# Patient Record
Sex: Female | Born: 1995 | Race: White | Hispanic: No | Marital: Single | Smoking: Current every day smoker
Health system: Southern US, Community
[De-identification: ages and names within clinical notes are randomized; demographics above are authoritative.]

## PROBLEM LIST (undated history)

## (undated) DIAGNOSIS — N926 Irregular menstruation, unspecified: Secondary | ICD-10-CM

## (undated) DIAGNOSIS — F329 Major depressive disorder, single episode, unspecified: Secondary | ICD-10-CM

## (undated) DIAGNOSIS — Z3046 Encounter for surveillance of implantable subdermal contraceptive: Principal | ICD-10-CM

## (undated) DIAGNOSIS — F419 Anxiety disorder, unspecified: Secondary | ICD-10-CM

## (undated) DIAGNOSIS — Z309 Encounter for contraceptive management, unspecified: Secondary | ICD-10-CM

## (undated) DIAGNOSIS — H9190 Unspecified hearing loss, unspecified ear: Secondary | ICD-10-CM

## (undated) DIAGNOSIS — F32A Depression, unspecified: Secondary | ICD-10-CM

## (undated) DIAGNOSIS — F988 Other specified behavioral and emotional disorders with onset usually occurring in childhood and adolescence: Secondary | ICD-10-CM

## (undated) HISTORY — DX: Other specified behavioral and emotional disorders with onset usually occurring in childhood and adolescence: F98.8

## (undated) HISTORY — DX: Irregular menstruation, unspecified: N92.6

## (undated) HISTORY — DX: Anxiety disorder, unspecified: F41.9

## (undated) HISTORY — DX: Unspecified hearing loss, unspecified ear: H91.90

## (undated) HISTORY — PX: TONSILLECTOMY AND ADENOIDECTOMY: SHX28

## (undated) HISTORY — DX: Encounter for contraceptive management, unspecified: Z30.9

## (undated) HISTORY — PX: OTHER SURGICAL HISTORY: SHX169

## (undated) HISTORY — DX: Major depressive disorder, single episode, unspecified: F32.9

## (undated) HISTORY — DX: Depression, unspecified: F32.A

## (undated) HISTORY — DX: Encounter for surveillance of implantable subdermal contraceptive: Z30.46

---
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
## 2015-06-13 ENCOUNTER — Encounter: Payer: Medicaid Other | Admitting: Women's Health

## 2015-06-13 ENCOUNTER — Encounter: Payer: Self-pay | Admitting: Women's Health

## 2015-06-27 ENCOUNTER — Encounter: Payer: Self-pay | Admitting: Women's Health

## 2015-06-27 ENCOUNTER — Encounter: Payer: Medicaid Other | Admitting: Women's Health

## 2015-07-10 ENCOUNTER — Encounter: Payer: Medicaid Other | Admitting: Women's Health

## 2015-07-10 ENCOUNTER — Encounter: Payer: Self-pay | Admitting: *Deleted

## 2015-09-20 ENCOUNTER — Ambulatory Visit (INDEPENDENT_AMBULATORY_CARE_PROVIDER_SITE_OTHER): Payer: Medicaid Other | Admitting: Adult Health

## 2015-09-20 ENCOUNTER — Encounter: Payer: Self-pay | Admitting: Adult Health

## 2015-09-20 VITALS — BP 120/80 | HR 90 | Ht 62.5 in | Wt 144.0 lb

## 2015-09-20 DIAGNOSIS — Z3202 Encounter for pregnancy test, result negative: Secondary | ICD-10-CM | POA: Diagnosis not present

## 2015-09-20 DIAGNOSIS — N938 Other specified abnormal uterine and vaginal bleeding: Secondary | ICD-10-CM

## 2015-09-20 DIAGNOSIS — Z30011 Encounter for initial prescription of contraceptive pills: Secondary | ICD-10-CM | POA: Diagnosis not present

## 2015-09-20 DIAGNOSIS — Z309 Encounter for contraceptive management, unspecified: Secondary | ICD-10-CM | POA: Insufficient documentation

## 2015-09-20 DIAGNOSIS — N926 Irregular menstruation, unspecified: Secondary | ICD-10-CM

## 2015-09-20 DIAGNOSIS — Z3046 Encounter for surveillance of implantable subdermal contraceptive: Secondary | ICD-10-CM | POA: Diagnosis not present

## 2015-09-20 HISTORY — DX: Encounter for surveillance of implantable subdermal contraceptive: Z30.46

## 2015-09-20 HISTORY — DX: Encounter for contraceptive management, unspecified: Z30.9

## 2015-09-20 HISTORY — DX: Irregular menstruation, unspecified: N92.6

## 2015-09-20 LAB — POCT URINE PREGNANCY: Preg Test, Ur: NEGATIVE

## 2015-09-20 MED ORDER — NORETHIN-ETH ESTRAD-FE BIPHAS 1 MG-10 MCG / 10 MCG PO TABS: 1.0000 | ORAL_TABLET | Freq: Every day | ORAL | Status: DC

## 2015-09-20 NOTE — Progress Notes (Signed)
Subjective:     Patient ID: Adrienne Parker, female   DOB: 24-Oct-1995, 20 y.o.   MRN: [id number]  HPI Adrienne Parker is a 20 year old white female in for nexplanon removal has had irregular bleeding with this nexplanon and wants it out and to try OCs.  Review of Systems Patient denies any headaches, hearing loss, fatigue, blurred vision, shortness of breath, chest pain, abdominal pain, problems with bowel movements, urination, or intercourse. No joint pain or mood swings.+ irregular bleeding, for nexplanon removal Reviewed past medical,surgical, social and family history. Reviewed medications and allergies.     Objective:   Physical Exam BP 120/80 mmHg  Pulse 90  Ht 5' 2.5" (1.588 m)  Wt 144 lb (65.318 kg)  BMI 25.90 kg/m2UPT negative,consent signed, time out called, Left arm cleansed with betadine, and injected with 1.5 cc 2% lidocaine and waited til numb.Under sterile technique a #11 blade was used to make small vertical incision, and a curved forceps was used to easily remove rod. Steri strips applied. Pressure dressing applied.She wants to start OCs.    Assessment:    Nexplanon removal Contraceptive management   Irregular bleeding with nexplanon   Plan:    GC/CHL sent on urine Use condoms x 4 weeks, keep clean and dry x 24 hours, no heavy lifting, keep steri strips on x 72 hours, Keep pressure dressing on x 24 hours. Follow up prn problems.   Rx  Lo loestrin take 1 daily disp 1 pack with 11 refills, given 1 pack to start today Pap and physical in 1 year

## 2015-09-20 NOTE — Patient Instructions (Signed)
Use condoms x 4 weeks, keep clean and dry x 24 hours, no heavy lifting, keep steri strips on x 72 hours, Keep pressure dressing on x 24 hours. Follow up prn problems. Start lo loestrin today Pap and physical in 1 year

## 2015-09-22 LAB — GC/CHLAMYDIA PROBE AMP
CHLAMYDIA, DNA PROBE: NEGATIVE
Neisseria gonorrhoeae by PCR: NEGATIVE

## 2015-11-16 ENCOUNTER — Telehealth (HOSPITAL_COMMUNITY): Payer: Self-pay | Admitting: *Deleted

## 2015-11-16 NOTE — Telephone Encounter (Signed)
Called pt to resch appt for April 19th but there's no voicemail.

## 2015-11-21 ENCOUNTER — Telehealth (HOSPITAL_COMMUNITY): Payer: Self-pay | Admitting: *Deleted

## 2015-11-21 ENCOUNTER — Encounter (HOSPITAL_COMMUNITY): Payer: Self-pay | Admitting: Psychiatry

## 2015-11-21 NOTE — Telephone Encounter (Signed)
Called number on file and phone does not have vm. Will try to call back at another time. Need to resch appt for December 06, 2015 due to provider being out of office.

## 2015-12-06 ENCOUNTER — Ambulatory Visit (HOSPITAL_COMMUNITY): Payer: Self-pay | Admitting: Psychiatry

## 2016-01-11 ENCOUNTER — Ambulatory Visit (INDEPENDENT_AMBULATORY_CARE_PROVIDER_SITE_OTHER): Payer: Medicaid Other | Admitting: Psychiatry

## 2016-01-11 ENCOUNTER — Other Ambulatory Visit (HOSPITAL_COMMUNITY): Payer: Self-pay | Admitting: Psychiatry

## 2016-01-11 ENCOUNTER — Encounter (HOSPITAL_COMMUNITY): Payer: Self-pay | Admitting: Psychiatry

## 2016-01-11 VITALS — BP 110/80 | Ht 62.0 in | Wt 148.0 lb

## 2016-01-11 DIAGNOSIS — F902 Attention-deficit hyperactivity disorder, combined type: Secondary | ICD-10-CM

## 2016-01-11 DIAGNOSIS — F909 Attention-deficit hyperactivity disorder, unspecified type: Secondary | ICD-10-CM | POA: Insufficient documentation

## 2016-01-11 DIAGNOSIS — F329 Major depressive disorder, single episode, unspecified: Secondary | ICD-10-CM | POA: Diagnosis not present

## 2016-01-11 DIAGNOSIS — F32A Depression, unspecified: Secondary | ICD-10-CM

## 2016-01-11 MED ORDER — VYVANSE 50 MG PO CAPS: 50.0000 mg | ORAL_CAPSULE | Freq: Every day | ORAL | Status: DC

## 2016-01-11 MED ORDER — PAROXETINE HCL 20 MG PO TABS: 20.0000 mg | ORAL_TABLET | Freq: Every day | ORAL | Status: DC

## 2016-01-11 MED ORDER — BUSPIRONE HCL 5 MG PO TABS: 5.0000 mg | ORAL_TABLET | Freq: Three times a day (TID) | ORAL | Status: DC
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
## 2016-02-07 ENCOUNTER — Ambulatory Visit (HOSPITAL_COMMUNITY): Payer: Medicaid Other | Admitting: Psychiatry

## 2016-02-09 ENCOUNTER — Ambulatory Visit (HOSPITAL_COMMUNITY): Payer: Medicaid Other | Admitting: Psychology

## 2016-03-06 ENCOUNTER — Telehealth (HOSPITAL_COMMUNITY): Payer: Self-pay | Admitting: *Deleted

## 2016-03-06 NOTE — Telephone Encounter (Signed)
Pt called stating she would like to resch appt for missed appt last week. Office informed pt they will call her back to resch. Called pt back at 2:24pm and lmtcb

## 2016-03-29 ENCOUNTER — Ambulatory Visit (HOSPITAL_COMMUNITY): Payer: Self-pay | Admitting: Psychiatry

## 2016-03-29 ENCOUNTER — Encounter (HOSPITAL_COMMUNITY): Payer: Self-pay

## 2016-04-02 ENCOUNTER — Encounter (HOSPITAL_COMMUNITY): Payer: Self-pay | Admitting: Psychiatry

## 2016-10-14 ENCOUNTER — Ambulatory Visit: Payer: Medicaid Other | Admitting: Adult Health

## 2019-08-20 NOTE — L&D Delivery Note (Signed)
OB/GYN Faculty Practice Delivery Note  Adrienne Parker is a 24 y.o. G1P0000 s/p NSVD at [redacted]w[redacted]d. She was admitted for IOL for gHTN.   ROM: 5h 78m with clear fluid GBS Status:  --[name] (02/24 1500) Maximum Maternal Temperature: 98.2 F  Labor Progress: . Patient presented to L&D for IOL for gHTN. Initial SVE: 0.5cm. Labor course was uncomplicated. Her blood pressures were labile but mostly in the normal range until about an hour prior to delivery. She mentioned after delivery that she had been pushing for about an hour but did not tell anyone. She then progressed to complete.   Delivery Date/Time: 10/22/2019 @ 1508 Delivery: Called to room and patient was complete and pushing. Baby delivered spontaneously by the nurse. No nuchal cord present. Shoulder and body delivered in usual fashion. Infant with spontaneous cry, placed on mother's abdomen, dried and stimulated. Cord clamped x 2 after 1-minute delay, and cut by FoB. Cord blood drawn. Placenta delivered spontaneously with gentle cord traction. Fundus firm with massage and pitocin started. Labia, perineum, vagina, and cervix inspected and significant for no lacerations.  Baby Weight: 2520g  Cord: central insertion, 3 vessel Placenta: Sent to L&D Complications: None Lacerations: None EBL: 75cc Analgesia: Epidural   Infant: APGAR (1 MIN): 9   APGAR (5 MINS): 9    Aldean Jewett MD, PGY-1 OBGYN Faculty Teaching Service  10/22/2019, 3:41 PM

## 2019-08-24 ENCOUNTER — Encounter: Payer: Self-pay | Admitting: Advanced Practice Midwife

## 2019-08-24 DIAGNOSIS — Z3400 Encounter for supervision of normal first pregnancy, unspecified trimester: Secondary | ICD-10-CM | POA: Insufficient documentation

## 2019-08-25 ENCOUNTER — Other Ambulatory Visit (HOSPITAL_COMMUNITY)
Admission: RE | Admit: 2019-08-25 | Discharge: 2019-08-25 | Disposition: A | Discharge status: Home / Self Care | Payer: Medicaid Other | Source: Ambulatory Visit | Attending: Adult Health | Admitting: Adult Health

## 2019-08-25 ENCOUNTER — Ambulatory Visit (INDEPENDENT_AMBULATORY_CARE_PROVIDER_SITE_OTHER): Payer: Medicaid Other | Admitting: Advanced Practice Midwife

## 2019-08-25 ENCOUNTER — Other Ambulatory Visit: Payer: Self-pay

## 2019-08-25 ENCOUNTER — Other Ambulatory Visit: Payer: Medicaid Other

## 2019-08-25 ENCOUNTER — Encounter: Payer: Self-pay | Admitting: Advanced Practice Midwife

## 2019-08-25 ENCOUNTER — Ambulatory Visit: Payer: Medicaid Other | Admitting: *Deleted

## 2019-08-25 VITALS — BP 136/84 | HR 79 | Wt 176.0 lb

## 2019-08-25 DIAGNOSIS — Z3A29 29 weeks gestation of pregnancy: Secondary | ICD-10-CM | POA: Diagnosis present

## 2019-08-25 DIAGNOSIS — Z130 Encounter for screening for diseases of the blood and blood-forming organs and certain disorders involving the immune mechanism: Secondary | ICD-10-CM

## 2019-08-25 DIAGNOSIS — Z0283 Encounter for blood-alcohol and blood-drug test: Secondary | ICD-10-CM

## 2019-08-25 DIAGNOSIS — Z31430 Encounter of female for testing for genetic disease carrier status for procreative management: Secondary | ICD-10-CM

## 2019-08-25 DIAGNOSIS — Z3400 Encounter for supervision of normal first pregnancy, unspecified trimester: Secondary | ICD-10-CM

## 2019-08-25 DIAGNOSIS — Z331 Pregnant state, incidental: Secondary | ICD-10-CM | POA: Insufficient documentation

## 2019-08-25 DIAGNOSIS — O0933 Supervision of pregnancy with insufficient antenatal care, third trimester: Secondary | ICD-10-CM | POA: Diagnosis not present

## 2019-08-25 DIAGNOSIS — Z124 Encounter for screening for malignant neoplasm of cervix: Secondary | ICD-10-CM | POA: Diagnosis present

## 2019-08-25 DIAGNOSIS — O99343 Other mental disorders complicating pregnancy, third trimester: Secondary | ICD-10-CM

## 2019-08-25 DIAGNOSIS — F17200 Nicotine dependence, unspecified, uncomplicated: Secondary | ICD-10-CM | POA: Insufficient documentation

## 2019-08-25 DIAGNOSIS — Z1371 Encounter for nonprocreative screening for genetic disease carrier status: Secondary | ICD-10-CM

## 2019-08-25 DIAGNOSIS — F329 Major depressive disorder, single episode, unspecified: Secondary | ICD-10-CM

## 2019-08-25 DIAGNOSIS — Z1389 Encounter for screening for other disorder: Secondary | ICD-10-CM

## 2019-08-25 LAB — POCT URINALYSIS DIPSTICK OB
Blood, UA: NEGATIVE
Glucose, UA: NEGATIVE
Ketones, UA: NEGATIVE
Leukocytes, UA: NEGATIVE
Nitrite, UA: NEGATIVE

## 2019-08-25 MED ORDER — BLOOD PRESSURE MONITOR MISC: 0 refills | Status: DC

## 2019-08-25 NOTE — Patient Instructions (Signed)
Otho Ket, I greatly value your feedback.  If you receive a survey following your visit with [place] today, we appreciate you taking the time to fill it out.  Thanks, Philipp Deputy, CNM  [facility]ITAL HAS MOVED!!! It is now [facility] at [facility] ([address] [place], [place] [address]) Entrance located off of [address] Free 24/7 valet parking   Nausea & Vomiting  Have saltine crackers or pretzels by your bed and eat a few bites before you raise your head out of bed in the morning  Eat small frequent meals throughout the day instead of large meals  Drink plenty of fluids throughout the day to stay hydrated, just don't drink a lot of fluids with your meals.  This can make your stomach fill up faster making you feel sick  Do not brush your teeth right after you eat  Products with real ginger are good for nausea, like ginger ale and ginger hard candy Make sure it says made with real ginger!  Sucking on sour candy like lemon heads is also good for nausea  If your prenatal vitamins make you nauseated, take them at night so you will sleep through the nausea  Sea Bands  If you feel like you need medicine for the nausea & vomiting please let us know  If you are unable to keep any fluids or food down please let us know   Constipation  Drink plenty of fluid, preferably water, throughout the day  Eat foods high in fiber such as fruits, vegetables, and grains  Exercise, such as walking, is a good way to keep your bowels regular  Drink warm fluids, especially warm prune juice, or decaf coffee  Eat a 1/2 cup of real oatmeal (not instant), 1/2 cup applesauce, and 1/2-1 cup warm prune juice every day  If needed, you may take Colace (docusate sodium) stool softener once or twice a day to help keep the stool soft.   If you still are having problems with constipation, you may take Miralax once daily as needed to help keep your bowels regular.   Home Blood Pressure  Monitoring for Patients   Your provider has recommended that you check your blood pressure (BP) at least once a week at home. If you do not have a blood pressure cuff at home, one will be provided for you. Contact your provider if you have not received your monitor within 1 week.   Helpful Tips for Accurate Home Blood Pressure Checks  . Don't smoke, exercise, or drink caffeine 30 minutes before checking your BP . Use the restroom before checking your BP (a full bladder can raise your pressure) . Relax in a comfortable upright chair . Feet on the ground . Left arm resting comfortably on a flat surface at the level of your heart . Legs uncrossed . Back supported . Sit quietly and don't talk . Place the cuff on your bare arm . Adjust snuggly, so that only two fingertips can fit between your skin and the top of the cuff . Check 2 readings separated by at least one minute . Keep a log of your BP readings . For a visual, please reference this diagram: http://ccnc.care/bpdiagram  Provider Name: [facility]     Phone: [phone]  Zone 1: ALL CLEAR  Continue to monitor your symptoms:  . BP reading is less than 140 (top number) or less than 90 (bottom number)  . No right upper stomach pain .  No headaches or seeing spots . No feeling nauseated or throwing up . No swelling in face and hands  Zone 2: CAUTION Call your doctor's office for any of the following:  . BP reading is greater than 140 (top number) or greater than 90 (bottom number)  . Stomach pain under your ribs in the middle or right side . Headaches or seeing spots . Feeling nauseated or throwing up . Swelling in face and hands  Zone 3: EMERGENCY  Seek immediate medical care if you have any of the following:  . BP reading is greater than160 (top number) or greater than 110 (bottom number) . Severe headaches not improving with Tylenol . Serious difficulty catching your breath . Any worsening symptoms from Zone 2     First Trimester of Pregnancy The first trimester of pregnancy is from week 1 until the end of week 12 (months 1 through 3). A week after a sperm fertilizes an egg, the egg will implant on the wall of the uterus. This embryo will begin to develop into a baby. Genes from you and your partner are forming the baby. The female genes determine whether the baby is a boy or a girl. At 6-8 weeks, the eyes and face are formed, and the heartbeat can be seen on ultrasound. At the end of 12 weeks, all the baby's organs are formed.  Now that you are pregnant, you will want to do everything you can to have a healthy baby. Two of the most important things are to get good prenatal care and to follow your health care provider's instructions. Prenatal care is all the medical care you receive before the baby's birth. This care will help prevent, find, and treat any problems during the pregnancy and childbirth. BODY CHANGES Your body goes through many changes during pregnancy. The changes vary from woman to woman.   You may gain or lose a couple of pounds at first.  You may feel sick to your stomach (nauseous) and throw up (vomit). If the vomiting is uncontrollable, call your health care provider.  You may tire easily.  You may develop headaches that can be relieved by medicines approved by your health care provider.  You may urinate more often. Painful urination may mean you have a bladder infection.  You may develop heartburn as a result of your pregnancy.  You may develop constipation because certain hormones are causing the muscles that push waste through your intestines to slow down.  You may develop hemorrhoids or swollen, bulging veins (varicose veins).  Your breasts may begin to grow larger and become tender. Your nipples may stick out more, and the tissue that surrounds them (areola) may become darker.  Your gums may bleed and may be sensitive to brushing and flossing.  Dark spots or blotches (chloasma,  mask of pregnancy) may develop on your face. This will likely fade after the baby is born.  Your menstrual periods will stop.  You may have a loss of appetite.  You may develop cravings for certain kinds of food.  You may have changes in your emotions from day to day, such as being excited to be pregnant or being concerned that something may go wrong with the pregnancy and baby.  You may have more vivid and strange dreams.  You may have changes in your hair. These can include thickening of your hair, rapid growth, and changes in texture. Some women also have hair loss during or after pregnancy, or hair that feels dry  or thin. Your hair will most likely return to normal after your baby is born. WHAT TO EXPECT AT YOUR PRENATAL VISITS During a routine prenatal visit:  You will be weighed to make sure you and the baby are growing normally.  Your blood pressure will be taken.  Your abdomen will be measured to track your baby's growth.  The fetal heartbeat will be listened to starting around week 10 or 12 of your pregnancy.  Test results from any previous visits will be discussed. Your health care provider may ask you:  How you are feeling.  If you are feeling the baby move.  If you have had any abnormal symptoms, such as leaking fluid, bleeding, severe headaches, or abdominal cramping.  If you have any questions. Other tests that may be performed during your first trimester include:  Blood tests to find your blood type and to check for the presence of any previous infections. They will also be used to check for low iron levels (anemia) and Rh antibodies. Later in the pregnancy, blood tests for diabetes will be done along with other tests if problems develop.  Urine tests to check for infections, diabetes, or protein in the urine.  An ultrasound to confirm the proper growth and development of the baby.  An amniocentesis to check for possible genetic problems.  Fetal screens for  spina bifida and Down syndrome.  You may need other tests to make sure you and the baby are doing well. HOME CARE INSTRUCTIONS  Medicines  Follow your health care provider's instructions regarding medicine use. Specific medicines may be either safe or unsafe to take during pregnancy.  Take your prenatal vitamins as directed.  If you develop constipation, try taking a stool softener if your health care provider approves. Diet  Eat regular, well-balanced meals. Choose a variety of foods, such as meat or vegetable-based protein, fish, milk and low-fat dairy products, vegetables, fruits, and whole grain breads and cereals. Your health care provider will help you determine the amount of weight gain that is right for you.  Avoid raw meat and uncooked cheese. These carry germs that can cause birth defects in the baby.  Eating four or five small meals rather than three large meals a day may help relieve nausea and vomiting. If you start to feel nauseous, eating a few soda crackers can be helpful. Drinking liquids between meals instead of during meals also seems to help nausea and vomiting.  If you develop constipation, eat more high-fiber foods, such as fresh vegetables or fruit and whole grains. Drink enough fluids to keep your urine clear or pale yellow. Activity and Exercise  Exercise only as directed by your health care provider. Exercising will help you:  Control your weight.  Stay in shape.  Be prepared for labor and delivery.  Experiencing pain or cramping in the lower abdomen or low back is a good sign that you should stop exercising. Check with your health care provider before continuing normal exercises.  Try to avoid standing for long periods of time. Move your legs often if you must stand in one place for a long time.  Avoid heavy lifting.  Wear low-heeled shoes, and practice good posture.  You may continue to have sex unless your health care provider directs you  otherwise. Relief of Pain or Discomfort  Wear a good support bra for breast tenderness.    Take warm sitz baths to soothe any pain or discomfort caused by hemorrhoids. Use hemorrhoid cream if your  health care provider approves.    Rest with your legs elevated if you have leg cramps or low back pain.  If you develop varicose veins in your legs, wear support hose. Elevate your feet for 15 minutes, 3-4 times a day. Limit salt in your diet. Prenatal Care  Schedule your prenatal visits by the twelfth week of pregnancy. They are usually scheduled monthly at first, then more often in the last 2 months before delivery.  Write down your questions. Take them to your prenatal visits.  Keep all your prenatal visits as directed by your health care provider. Safety  Wear your seat belt at all times when driving.  Make a list of emergency phone numbers, including numbers for family, friends, the hospital, and police and fire departments. General Tips  Ask your health care provider for a referral to a local prenatal education class. Begin classes no later than at the beginning of month 6 of your pregnancy.  Ask for help if you have counseling or nutritional needs during pregnancy. Your health care provider can offer advice or refer you to specialists for help with various needs.  Do not use hot tubs, steam rooms, or saunas.  Do not douche or use tampons or scented sanitary pads.  Do not cross your legs for long periods of time.  Avoid cat litter boxes and soil used by cats. These carry germs that can cause birth defects in the baby and possibly loss of the fetus by miscarriage or stillbirth.  Avoid all smoking, herbs, alcohol, and medicines not prescribed by your health care provider. Chemicals in these affect the formation and growth of the baby.  Schedule a dentist appointment. At home, brush your teeth with a soft toothbrush and be gentle when you floss. SEEK MEDICAL CARE IF:   You have  dizziness.  You have mild pelvic cramps, pelvic pressure, or nagging pain in the abdominal area.  You have persistent nausea, vomiting, or diarrhea.  You have a bad smelling vaginal discharge.  You have pain with urination.  You notice increased swelling in your face, hands, legs, or ankles. SEEK IMMEDIATE MEDICAL CARE IF:   You have a fever.  You are leaking fluid from your vagina.  You have spotting or bleeding from your vagina.  You have severe abdominal cramping or pain.  You have rapid weight gain or loss.  You vomit blood or material that looks like coffee grounds.  You are exposed to [place] measles and have never had them.  You are exposed to fifth disease or chickenpox.  You develop a severe headache.  You have shortness of breath.  You have any kind of trauma, such as from a fall or a car accident. Document Released: 07/30/2001 Document Revised: 12/20/2013 Document Reviewed: 06/15/2013 [facility] Patient Information 2015 [place], [place]. This information is not intended to replace advice given to you by your health care provider. Make sure you discuss any questions you have with your health care provider.  Coronavirus (COVID-19) Are you at risk?  Are you at risk for the Coronavirus (COVID-19)?  To be considered HIGH RISK for Coronavirus (COVID-19), you have to meet the following criteria:  . Traveled to [place], [place], [place], [place] or [place]; or in the [place] to [place], [place], [place], or [place]; and have fever, cough, and shortness of breath within the last 2 weeks of travel OR . Been in close contact with a person diagnosed with COVID-19 within the last 2 weeks and  have fever, cough, and shortness of breath . IF YOU DO NOT MEET THESE CRITERIA, YOU ARE CONSIDERED LOW RISK FOR COVID-19.  What to do if you are HIGH RISK for COVID-19?  Marland Kitchen If you are having a medical emergency, call 911. . Seek medical care right away. Before you go to a  doctor's office, urgent care or emergency department, call ahead and tell them about your recent travel, contact with someone diagnosed with COVID-19, and your symptoms. You should receive instructions from your physician's office regarding next steps of care.  . When you arrive at healthcare provider, tell the healthcare staff immediately you have returned from visiting [place], [place], [place], [place] or [place]; or traveled in the [place] to [place], [place], [place], or [place]; in the last two weeks or you have been in close contact with a person diagnosed with COVID-19 in the last 2 weeks.   . Tell the health care staff about your symptoms: fever, cough and shortness of breath. . After you have been seen by a medical provider, you will be either: o Tested for (COVID-19) and discharged home on quarantine except to seek medical care if symptoms worsen, and asked to  - Stay home and avoid contact with others until you get your results (4-5 days)  - Avoid travel on public transportation if possible (such as bus, train, or airplane) or o Sent to the Emergency Department by EMS for evaluation, COVID-19 testing, and possible admission depending on your condition and test results.  What to do if you are LOW RISK for COVID-19?  Reduce your risk of any infection by using the same precautions used for avoiding the common cold or flu:  Marland Kitchen Wash your hands often with soap and warm water for at least 20 seconds.  If soap and water are not readily available, use an alcohol-based hand sanitizer with at least 60% alcohol.  . If coughing or sneezing, cover your mouth and nose by coughing or sneezing into the elbow areas of your shirt or coat, into a tissue or into your sleeve (not your hands). . Avoid shaking hands with others and consider head nods or verbal greetings only. . Avoid touching your eyes, nose, or mouth with unwashed hands.  . Avoid close contact with people who are sick. . Avoid  places or events with large numbers of people in one location, like concerts or sporting events. . Carefully consider travel plans you have or are making. . If you are planning any travel outside or inside the [place], visit the CDC's Travelers' Health webpage for the latest health notices. . If you have some symptoms but not all symptoms, continue to monitor at home and seek medical attention if your symptoms worsen. . If you are having a medical emergency, call 911.   [place] / e-Visit: [email or web address]         [facility]: [facility]: [id number]                   [facility]: [phone]

## 2019-08-25 NOTE — Progress Notes (Signed)
INITIAL OBSTETRICAL VISIT Patient name: Adrienne Parker MRN [id number]  Date of birth: 1996/05/06 Chief Complaint:   Initial Prenatal Visit  History of Present Illness:   Adrienne Parker is a 24 y.o. G73P0000 Caucasian female at [redacted]w[redacted]d by 6wk u/s at outside location with an Estimated Date of Delivery: 11/09/19 being seen today for her initial obstetrical visit.   Her obstetrical history is significant for nullip; was uncertain about continuing the pregnancy until 1-2 months ago; FOB is very supportive; requests referral for counseling.   Today she reports no complaints.  No LMP recorded. Patient is pregnant. Last pap none yet.  Review of Systems:   Pertinent items are noted in HPI Denies cramping/contractions, leakage of fluid, vaginal bleeding, abnormal vaginal discharge w/ itching/odor/irritation, headaches, visual changes, shortness of breath, chest pain, abdominal pain, severe nausea/vomiting, or problems with urination or bowel movements unless otherwise stated above.  Pertinent History Reviewed:  Reviewed past medical,surgical, social, obstetrical and family history.  Reviewed problem list, medications and allergies. OB History  Gravida Para Term Preterm AB Living  1 0 0 0 0 0  SAB TAB Ectopic Multiple Live Births  0 0 0 0      # Outcome Date GA Lbr Len/2nd Weight Sex Delivery Anes PTL Lv  1 Current            Physical Assessment:   Vitals:   08/25/19 0939  BP: 136/84  Pulse: 79  Weight: 176 lb (79.8 kg)  Body mass index is 32.19 kg/m.       Physical Examination:  General appearance - well appearing, and in no distress  Mental status - alert, oriented to person, place, and time  Psych:  She has a normal mood and affect  Skin - warm and dry, normal color, no suspicious lesions noted  Chest - effort normal, all lung fields clear to auscultation bilaterally  Heart - normal rate and regular rhythm  Abdomen - soft, nontender  Extremities:  No swelling or varicosities  noted  Pelvic - VULVA: normal appearing vulva with no masses, tenderness or lesions  VAGINA: normal appearing vagina with normal color and discharge, no lesions  CERVIX: normal appearing cervix without discharge or lesions, no CMT  Thin prep pap is done without HR HPV cotesting  TODAY'S NT too late  Results for orders placed or performed in visit on 08/25/19 (from the past 24 hour(s))  POC Urinalysis Dipstick OB   Collection Time: 08/25/19 10:06 AM  Result Value Ref Range   Color, UA     Clarity, UA     Glucose, UA Negative Negative   Bilirubin, UA     Ketones, UA neg    Spec Grav, UA     Blood, UA neg    pH, UA     POC,PROTEIN,UA Trace Negative, Trace, Small (1+), Moderate (2+), Large (3+), 4+   Urobilinogen, UA     Nitrite, UA neg    Leukocytes, UA Negative Negative   Appearance     Odor      Assessment & Plan:  1) Low-Risk Pregnancy G1P0000 at [redacted]w[redacted]d with an Estimated Date of Delivery: 11/09/19   2) Initial OB visit- late to care @ 29wks  3) Anxiety/depression, has been on meds in the past but none currently; would prefer a counseling referral currently and will let us know if she wants to try meds  4) Smoker, rec to cut down/stop  Meds:  Meds ordered this encounter  Medications  . Blood Pressure  Monitor MISC    Sig: For regular home bp monitoring during pregnancy    Dispense:  1 each    Refill:  0    Z34.03    Initial labs obtained Continue prenatal vitamins Reviewed n/v relief measures and warning s/s to report Reviewed recommended weight gain based on pre-gravid BMI Encouraged well-balanced diet Genetic Screening discussed: requested Cystic fibrosis, SMA, Fragile X screening discussed undecided Ultrasound discussed; fetal survey: requested CCNC completed>PCM not here, form faxed The nature of [place] for [employer] with multiple MDs and other Advanced Practice Providers was explained to patient; also emphasized that fellows, residents, and  students are part of our team. Needs home bp cuff. Rx sent. Check bp weekly, let us know if >140/90.   Follow-up: Return for [place]: Anatomy, ASAP and GTT, then 2wk LROB in person.   Orders Placed This Encounter  Procedures  . Urine Culture  . Hemoglobinopathy evaluation  . Obstetric Panel, Including HIV  . Pain Management Screening Profile (10S)  . MaterniT 21 plus Core, Blood  . Inheritest Core(CF97,SMA,FraX)  . POC Urinalysis Dipstick OB    Myrtis Ser [facility] 08/25/2019 10:28 AM

## 2019-08-26 LAB — PMP SCREEN PROFILE (10S), URINE
Amphetamine Scrn, Ur: NEGATIVE ng/mL
BARBITURATE SCREEN URINE: NEGATIVE ng/mL
BENZODIAZEPINE SCREEN, URINE: NEGATIVE ng/mL
CANNABINOIDS UR QL SCN: POSITIVE ng/mL — AB
Cocaine (Metab) Scrn, Ur: NEGATIVE ng/mL
Creatinine(Crt), U: 258.2 mg/dL (ref 20.0–300.0)
Methadone Screen, Urine: NEGATIVE ng/mL
OXYCODONE+OXYMORPHONE UR QL SCN: NEGATIVE ng/mL
Opiate Scrn, Ur: NEGATIVE ng/mL
Ph of Urine: 6.2 (ref 4.5–8.9)
Phencyclidine Qn, Ur: NEGATIVE ng/mL
Propoxyphene Scrn, Ur: NEGATIVE ng/mL

## 2019-08-27 LAB — URINE CULTURE: Organism ID, Bacteria: NO GROWTH

## 2019-08-27 LAB — CYTOLOGY - PAP
Chlamydia: NEGATIVE
Comment: NEGATIVE
Comment: NORMAL
Diagnosis: NEGATIVE
Neisseria Gonorrhea: NEGATIVE

## 2019-08-27 LAB — OBSTETRIC PANEL, INCLUDING HIV
Antibody Screen: NEGATIVE
Basophils Absolute: 0 10*3/uL (ref 0.0–0.2)
Basos: 0 %
EOS (ABSOLUTE): 0.2 10*3/uL (ref 0.0–0.4)
Eos: 2 %
HIV Screen 4th Generation wRfx: NONREACTIVE
Hematocrit: 36.5 % (ref 34.0–46.6)
Hemoglobin: 13 g/dL (ref 11.1–15.9)
Hepatitis B Surface Ag: NEGATIVE
Immature Grans (Abs): 0 10*3/uL (ref 0.0–0.1)
Immature Granulocytes: 0 %
Lymphocytes Absolute: 1.5 10*3/uL (ref 0.7–3.1)
Lymphs: 14 %
MCH: 33 pg (ref 26.6–33.0)
MCHC: 35.6 g/dL (ref 31.5–35.7)
MCV: 93 fL (ref 79–97)
Monocytes Absolute: 0.5 10*3/uL (ref 0.1–0.9)
Monocytes: 5 %
Neutrophils Absolute: 8 10*3/uL — ABNORMAL HIGH (ref 1.4–7.0)
Neutrophils: 79 %
Platelets: 277 10*3/uL (ref 150–450)
RBC: 3.94 x10E6/uL (ref 3.77–5.28)
RDW: 12.5 % (ref 11.7–15.4)
RPR Ser Ql: NONREACTIVE
Rh Factor: POSITIVE
Rubella Antibodies, IGG: 1.92 index (ref >0.99)
WBC: 10.2 10*3/uL (ref 3.4–10.8)

## 2019-08-27 LAB — HEMOGLOBINOPATHY EVALUATION
HGB C: 0 %
HGB S: 0 %
HGB VARIANT: 0 %
Hemoglobin A2 Quantitation: 2.4 % (ref 1.8–3.2)
Hemoglobin F Quantitation: 0 % (ref 0.0–2.0)
Hgb A: 97.6 % (ref 96.4–98.8)

## 2019-08-29 ENCOUNTER — Encounter: Payer: Self-pay | Admitting: Advanced Practice Midwife

## 2019-08-29 DIAGNOSIS — F1290 Cannabis use, unspecified, uncomplicated: Secondary | ICD-10-CM | POA: Insufficient documentation

## 2019-08-31 ENCOUNTER — Other Ambulatory Visit: Payer: Medicaid Other

## 2019-09-02 ENCOUNTER — Other Ambulatory Visit: Payer: Self-pay | Admitting: Advanced Practice Midwife

## 2019-09-02 DIAGNOSIS — Z363 Encounter for antenatal screening for malformations: Secondary | ICD-10-CM

## 2019-09-03 ENCOUNTER — Other Ambulatory Visit: Payer: Self-pay

## 2019-09-03 ENCOUNTER — Other Ambulatory Visit: Payer: Medicaid Other

## 2019-09-03 ENCOUNTER — Ambulatory Visit (INDEPENDENT_AMBULATORY_CARE_PROVIDER_SITE_OTHER): Payer: Medicaid Other

## 2019-09-03 DIAGNOSIS — Z363 Encounter for antenatal screening for malformations: Secondary | ICD-10-CM

## 2019-09-03 DIAGNOSIS — O0933 Supervision of pregnancy with insufficient antenatal care, third trimester: Secondary | ICD-10-CM

## 2019-09-03 DIAGNOSIS — Z3A30 30 weeks gestation of pregnancy: Secondary | ICD-10-CM

## 2019-09-03 DIAGNOSIS — F1290 Cannabis use, unspecified, uncomplicated: Secondary | ICD-10-CM

## 2019-09-03 NOTE — Progress Notes (Signed)
[place] 30+3 wks,cephalic,anterior placenta gr 2,normal ovaries,afi 15 cm,fhr 146 bpm,efw 1467 g 21%,anatomy complete,no obvious abnormalities

## 2019-09-04 LAB — CBC
Hematocrit: 35.1 % (ref 34.0–46.6)
Hemoglobin: 12.3 g/dL (ref 11.1–15.9)
MCH: 33 pg (ref 26.6–33.0)
MCHC: 35 g/dL (ref 31.5–35.7)
MCV: 94 fL (ref 79–97)
Platelets: 246 10*3/uL (ref 150–450)
RBC: 3.73 x10E6/uL — ABNORMAL LOW (ref 3.77–5.28)
RDW: 12.7 % (ref 11.7–15.4)
WBC: 7.6 10*3/uL (ref 3.4–10.8)

## 2019-09-04 LAB — GLUCOSE TOLERANCE, 2 HOURS W/ 1HR
Glucose, 1 hour: 150 mg/dL (ref 65–179)
Glucose, 2 hour: 86 mg/dL (ref 65–152)
Glucose, Fasting: 83 mg/dL (ref 65–91)

## 2019-09-04 LAB — HIV ANTIBODY (ROUTINE TESTING W REFLEX): HIV Screen 4th Generation wRfx: NONREACTIVE

## 2019-09-04 LAB — RPR: RPR Ser Ql: NONREACTIVE

## 2019-09-04 LAB — ANTIBODY SCREEN: Antibody Screen: NEGATIVE

## 2019-09-07 LAB — MATERNIT 21 PLUS CORE, BLOOD
Fetal Fraction: 15
Result (T21): NEGATIVE
Trisomy 13 (Patau syndrome): NEGATIVE
Trisomy 18 (Edwards syndrome): NEGATIVE
Trisomy 21 (Down syndrome): NEGATIVE

## 2019-09-07 LAB — INHERITEST CORE(CF97,SMA,FRAX)

## 2019-09-08 ENCOUNTER — Ambulatory Visit (INDEPENDENT_AMBULATORY_CARE_PROVIDER_SITE_OTHER): Payer: Medicaid Other | Admitting: Obstetrics and Gynecology

## 2019-09-08 ENCOUNTER — Other Ambulatory Visit: Payer: Self-pay

## 2019-09-08 VITALS — BP 129/84 | HR 107 | Wt 178.4 lb

## 2019-09-08 DIAGNOSIS — Z3483 Encounter for supervision of other normal pregnancy, third trimester: Secondary | ICD-10-CM

## 2019-09-08 DIAGNOSIS — Z331 Pregnant state, incidental: Secondary | ICD-10-CM

## 2019-09-08 DIAGNOSIS — Z3A31 31 weeks gestation of pregnancy: Secondary | ICD-10-CM

## 2019-09-08 DIAGNOSIS — Z1389 Encounter for screening for other disorder: Secondary | ICD-10-CM

## 2019-09-08 LAB — POCT URINALYSIS DIPSTICK OB
Blood, UA: NEGATIVE
Glucose, UA: NEGATIVE
Ketones, UA: NEGATIVE
Leukocytes, UA: NEGATIVE
Nitrite, UA: NEGATIVE
POC,PROTEIN,UA: NEGATIVE

## 2019-09-08 NOTE — Progress Notes (Signed)
   LOW-RISK PREGNANCY VISIT Patient name: Adrienne Parker MRN [id number]  Date of birth: 1996/05/24 Chief Complaint:   Routine Prenatal Visit  History of Present Illness:   Adrienne Parker is a 24 y.o. G70P0000 female at [redacted]w[redacted]d with an Estimated Date of Delivery: 11/09/19 being seen today for ongoing management of a low-risk pregnancy.  Today she reports no complaints. Contractions: Not present. Vag. Bleeding: None.  Movement: Present. denies leaking of fluid. Review of Systems:   Pertinent items are noted in HPI Denies abnormal vaginal discharge w/ itching/odor/irritation, headaches, visual changes, shortness of breath, chest pain, abdominal pain, severe nausea/vomiting, or problems with urination or bowel movements unless otherwise stated above. Pertinent History Reviewed:  Reviewed past medical,surgical, social, obstetrical and family history.  Reviewed problem list, medications and allergies. Physical Assessment:   Vitals:   09/08/19 1437  BP: 129/84  Pulse: (!) 107  Weight: 178 lb 6.4 oz (80.9 kg)  Body mass index is 32.63 kg/m.        Physical Examination:   General appearance: Well appearing, and in no distress  Mental status: Alert, oriented to person, place, and time  Skin: Warm & dry  Cardiovascular: Normal heart rate noted  Respiratory: Normal respiratory effort, no distress  Abdomen: Soft, gravid, nontender  Pelvic: Cervical exam deferred         Extremities: Edema: Trace  Fetal Status:     Movement: Present  good fm.  Chaperone: n/a    Results for orders placed or performed in visit on 09/08/19 (from the past 24 hour(s))  POC Urinalysis Dipstick OB   Collection Time: 09/08/19  2:39 PM  Result Value Ref Range   Color, UA     Clarity, UA     Glucose, UA Negative Negative   Bilirubin, UA     Ketones, UA n    Spec Grav, UA     Blood, UA n    pH, UA     POC,PROTEIN,UA Negative Negative, Trace, Small (1+), Moderate (2+), Large (3+), 4+   Urobilinogen, UA     Nitrite,  UA n    Leukocytes, UA Negative Negative   Appearance     Odor      Assessment & Plan:  1) Low-risk pregnancy G1P0000 at [redacted]w[redacted]d with an Estimated Date of Delivery: 11/09/19    2) Sad after loss of parents. 2016 Good FOB support.   Meds: No orders of the defined types were placed in this encounter.  Labs/procedures today: u/s last week  Plan:  Continue routine obstetrical care  Next visit: prefers online    Reviewed: Preterm labor symptoms and general obstetric precautions including but not limited to vaginal bleeding, contractions, leaking of fluid and fetal movement were reviewed in detail with the patient.  All questions were answered. Doesn't have home bp cuff. Has waited for it x 2 wks.. Check bp weekly, let us know if >140/90.   Follow-up:   Orders Placed This Encounter  Procedures  . POC Urinalysis Dipstick OB   Tilda Burrow  MD [facility] 09/08/2019 3:04 PM

## 2019-09-08 NOTE — Patient Instructions (Signed)
[phone] is the phone number for Pregnancy Classes or hospital tours at [facility].   You will be referred to  [email or web address]   for more information on childbirth classes   At this site you may register for classes. You may sign up for a waiting list if classes are full. Please SIGN UP FOR THIS!.   When the waiting list becomes long, sometimes new classes can be added.  [facility] at  Call to Register: [phone] or [phone]   or   Register Online: [email or web address] THESE CLASSES FILL UP VERY QUICKLY, SO SIGN UP AS SOON AS YOU CAN!!! Please visit [facility] pregnancy website at [email or web address]  Childbirth Classes  Option 1: Birth & Baby Series ? Series of 3 weekly classes, on the same day of the week (can choose Mon-Thurs) from 6-9pm ? Helps you and your support person prepare for childbirth ? Reviews newborn care, labor & birth, cesarean birth, pain management, and comfort techniques ? Cost: $60 per couple for insured or self-pay, $30 per couple for Medicaid  Option 2: Weekend Birth & Baby ? This class is a weekend version of our Birth & Baby series.  It is designed for parents who have a difficult time fitting several weeks of classes into their schedule.   ? Covers the care of your newborn and the basics of labor and childbirth ? Friday 6:30pm-8:30pm Saturday 9am-4pm, includes lunch for you and your partner  ? Cost: $75 per couple for insured or self-pay, $30 per couple for Medicaid  Option 3: Natural Childbirth ? This series of 5 weekly classes is for expectant parents who want to learn and practice natural methods of coping with the process of labor and childbirth.  Can choose Mon or Tues, 7-9pm.   ? Covers relaxation, breathing, massage, visualization, role of the partner, and helpful positioning ? Participants learn how to be confident  in their body's ability to give birth. Class empowers and helps parents make informed decisions about care. Includes discussion that will help new parents transition into the immediate postpartum period.  ? Cost: $75 per couple for insured or self-pay, $30 per couple for Medicaid  Option 4: Online Birth & Baby ? This online class offers you the freedom to complete a Birth & Baby series in the comfort of your own home.  The flexibility of this option allows you to review sections at your own pace, at times convenient to you and your support people.  It includes additional video information, animations, quizzes and extended activities. Get organized with helpful eClass tools, checklists, and trackers.  ? Cost: $60 for 60 days of online access                                                                            Other Available Classes  Baby & Me Enjoy this time to discuss newborn & infant parenting topics and family adjustment issues with other new mothers in a relaxed environment. Each week brings a new speaker or baby-centered activity. We encourage mothers and their babies (birth to crawling) to join us. You are welcome to visit this group even if you haven't delivered yet! It's wonderful to make new friends early   and watch other moms interact with their babies. No registration or fee.  Big Brother/Big Sister Let your children share in the joy of a new brother or sister in this special class designed just for them. Discussion includes how families care for babies: swaddling, holding, diapering, safety, as well as how they can be helpful in their new role. This class is designed for children ages 2 to 6, but any age is welcome. Please register each child individually. $5 Breastfeeding Support Group This group is a mother-to-mother support circle where moms have the opportunity to share their breastfeeding experiences. A Breastfeeding Support nurse is present for questions and concerns. An infant  scale is available for weight checks. No fee or registration.  Breastfeeding Your Baby Breastfeeding is a special time for mother and child. This class will help you feel ready to begin this important relationship. Your partner is encouraged to attend with you. Learn what to expect and feel more confident in the first days of breastfeeding your newborn. This class also addresses the most common fears and challenges of breastfeeding during the first few weeks, months, and beyond. $30 per couple Caring for Baby This class is for expectant and adoptive parents who want to learn and practice the most up-to-date newborn care for their babies. Focus is on birth through first six weeks of life. Topics include feeding, bathing, diapering, crying, umbilical cord care, circumcision care and safe sleep. Parents learn how to recognize symptoms of illness and when to call the pediatrician. Register only the mom-to-be and your partner can plan to come with you. (*Note: This class is included in the Birth & Baby series and the Weekend Birth & Baby classes.) $10 per couple Comfort Techniques & Tour This 2-hour interactive class is designed for those who either do not wish to take the Birth & Baby series or for those who prefer our online childbirth class, but don't want to miss the opportunity to learn and practice hands-on techniques. These skills can help relieve some of the discomfort of labor and encourage your baby to rotate toward the best position for birth. You and your partner will be able to try a variety of labor positions with birth balls and rebozos as well as practice breathing, relaxation, and visual techniques. $20 per couple Daddy Boot Camp This course offers Dads-to-be the tools and knowledge needed to feel confident on their journey to becoming new fathers. Experienced dads, who have been trained as coaches, teach dads-to-be how to hold, comfort, diapers, swaddle and play with their infant while being  able to support the new mom as well. $25 Grandparent Love Expecting a grandbaby? Learn about the latest infant care and safety recommendations and ways to support your own child as he or she transitions into the parenting role. $10 per person Infant and Child CPR Parents, grandparents, babysitters, and friends learn Cardio-Pulmonary Resuscitation skills for infants and children. You will also learn how to treat both conscious and unconscious choking infants and children. Register each participant individually. (Note: This Family & Friends program does not offer certification.) $20 per person Marvelous Multiples Expecting twins, triplets, or more? This free 2-hour class covers the differences in labor, birth, parenting, and breastfeeding issues that face multiples' parents.  [facility] Center Virtual Tour  Online virtual tour of the new [facility] at   Mom Talk This free mom-led group offers support and connection to mothers as they journey through the adjustments and struggles of that   sometimes overwhelming first year after the birth of a child. A member of our staff will be present to share resources and additional support if needed, as you care for yourself and baby. You are welcome to visit this group before you deliver! It's wonderful to meet new friends early and watch other moms interact with their babies.  Waterbirth Class Interested in a waterbirth? This free informational class will help you discover whether waterbirth is the right fit for you and is required if you are planning a waterbirth. Education about waterbirth itself, supplies you may need, and what you may need from your support team is included in this class. Partners are encouraged to come.    

## 2019-09-22 ENCOUNTER — Telehealth: Payer: Medicaid Other | Admitting: Family Medicine

## 2019-09-23 ENCOUNTER — Ambulatory Visit (INDEPENDENT_AMBULATORY_CARE_PROVIDER_SITE_OTHER): Payer: Medicaid Other | Admitting: Advanced Practice Midwife

## 2019-09-23 ENCOUNTER — Other Ambulatory Visit: Payer: Self-pay

## 2019-09-23 VITALS — BP 131/88 | HR 109 | Wt 179.0 lb

## 2019-09-23 DIAGNOSIS — Z3400 Encounter for supervision of normal first pregnancy, unspecified trimester: Secondary | ICD-10-CM

## 2019-09-23 DIAGNOSIS — Z3A33 33 weeks gestation of pregnancy: Secondary | ICD-10-CM

## 2019-09-23 DIAGNOSIS — Z3483 Encounter for supervision of other normal pregnancy, third trimester: Secondary | ICD-10-CM

## 2019-09-23 DIAGNOSIS — Z1389 Encounter for screening for other disorder: Secondary | ICD-10-CM

## 2019-09-23 DIAGNOSIS — Z331 Pregnant state, incidental: Secondary | ICD-10-CM

## 2019-09-23 LAB — POCT URINALYSIS DIPSTICK OB
Blood, UA: NEGATIVE
Glucose, UA: NEGATIVE
Ketones, UA: NEGATIVE
Leukocytes, UA: NEGATIVE
Nitrite, UA: NEGATIVE
POC,PROTEIN,UA: NEGATIVE

## 2019-09-23 MED ORDER — PNV PRENATAL PLUS MULTIVITAMIN 27-1 MG PO TABS: 1.0000 | ORAL_TABLET | Freq: Every day | ORAL | 11 refills | Status: AC

## 2019-09-23 NOTE — Progress Notes (Signed)
   LOW-RISK PREGNANCY VISIT Patient name: Adrienne Parker MRN [id number]  Date of birth: Sep 14, 1995 Chief Complaint:   Routine Prenatal Visit  History of Present Illness:   Adrienne Parker is a 24 y.o. G22P0000 female at [redacted]w[redacted]d with an Estimated Date of Delivery: 11/09/19 being seen today for ongoing management of a low-risk pregnancy.  Today she reports no complaints. Contractions: Not present. Vag. Bleeding: None.  Movement: Present. denies leaking of fluid.  Has hx of depression and wants to start counseling.  Asked for referral over a month ago, unsure if anything was ever sent.  Denies SI/HI.  Review of Systems:   Pertinent items are noted in HPI Denies abnormal vaginal discharge w/ itching/odor/irritation, headaches, visual changes, shortness of breath, chest pain, abdominal pain, severe nausea/vomiting, or problems with urination or bowel movements unless otherwise stated above. Pertinent History Reviewed:  Reviewed past medical,surgical, social, obstetrical and family history.  Reviewed problem list, medications and allergies. Physical Assessment:   Vitals:   09/23/19 1007  BP: 131/88  Pulse: (!) 109  Weight: 179 lb (81.2 kg)  Body mass index is 32.74 kg/m.        Physical Examination:   General appearance: Well appearing, and in no distress  Mental status: Alert, oriented to person, place, and time  Skin: Warm & dry  Cardiovascular: Normal heart rate noted  Respiratory: Normal respiratory effort, no distress  Abdomen: Soft, gravid, nontender  Pelvic: Cervical exam deferred         Extremities: Edema: Trace  Fetal Status:     Movement: Present    Chaperone: n/a    Results for orders placed or performed in visit on 09/23/19 (from the past 24 hour(s))  POC Urinalysis Dipstick OB   Collection Time: 09/23/19 10:11 AM  Result Value Ref Range   Color, UA     Clarity, UA     Glucose, UA Negative Negative   Bilirubin, UA     Ketones, UA neg    Spec Grav, UA     Blood, UA neg      pH, UA     POC,PROTEIN,UA Negative Negative, Trace, Small (1+), Moderate (2+), Large (3+), 4+   Urobilinogen, UA     Nitrite, UA neg    Leukocytes, UA Negative Negative   Appearance     Odor      Assessment & Plan:  1) Low-risk pregnancy G1P0000 at [redacted]w[redacted]d with an Estimated Date of Delivery: 11/09/19   2) depression, Hasn't heard from anyone and I can't see where a referral was sent--referral to Merlene Pulling faxed. Pt given her # to call and schedule appt   Meds: No orders of the defined types were placed in this encounter.  Labs/procedures today: none  Plan:  Continue routine obstetrical care  Next visit: prefers will be in person for still not having BP cuff    Reviewed: Preterm labor symptoms and general obstetric precautions including but not limited to vaginal bleeding, contractions, leaking of fluid and fetal movement were reviewed in detail with the patient.  All questions were answered. Doesn't have home bp cuff. Will check on it. .   Follow-up: Return in about 2 weeks (around 10/07/2019) for [facility] Mychart visit.  Orders Placed This Encounter  Procedures  . POC Urinalysis Dipstick OB   Jacklyn Shell DNP, CNM 09/23/2019 10:25 AM  Pt's number:  [phone]

## 2019-09-23 NOTE — Patient Instructions (Addendum)
Adrienne Parker, I greatly value your feedback.  If you receive a survey following your visit with [place] today, we appreciate you taking the time to fill it out.  Thanks, Adrienne Beams, DNP, CNM  [facility] HAS MOVED!!! It is now [facility] at [facility] ([address] [place], [place] [address]) Entrance located off of [address] Free 24/7 valet parking   Go to [email or web address] to register for FREE online childbirth classes    Call the office [phone]) or go to [facility] if:  You begin to have strong, frequent contractions  Your water breaks.  Sometimes it is a big gush of fluid, sometimes it is just a trickle that keeps getting your panties wet or running down your legs  You have vaginal bleeding.  It is normal to have a small amount of spotting if your cervix was checked.   You don't feel your baby moving like normal.  If you don't, get you something to eat and drink and lay down and focus on feeling your baby move.  You should feel at least 10 movements in 2 hours.  If you don't, you should call the office or go to [facility].   Home Blood Pressure Monitoring for Patients   Your provider has recommended that you check your blood pressure (BP) at least once a week at home. If you do not have a blood pressure cuff at home, one will be provided for you. Contact your provider if you have not received your monitor within 1 week.   Helpful Tips for Accurate Home Blood Pressure Checks  . Don't smoke, exercise, or drink caffeine 30 minutes before checking your BP . Use the restroom before checking your BP (a full bladder can raise your pressure) . Relax in a comfortable upright chair . Feet on the ground . Left arm resting comfortably on a flat surface at the level of your heart . Legs uncrossed . Back supported . Sit quietly and don't talk . Place the cuff on your bare arm . Adjust snuggly, so that only two fingertips can fit  between your skin and the top of the cuff . Check 2 readings separated by at least one minute . Keep a log of your BP readings . For a visual, please reference this diagram: http://[facility].care/bpdiagram  Provider Name: [facility]     Phone: [phone]  Zone 1: ALL CLEAR  Continue to monitor your symptoms:  . BP reading is less than 140 (top number) or less than 90 (bottom number)  . No right upper stomach pain . No headaches or seeing spots . No feeling nauseated or throwing up . No swelling in face and hands  Zone 2: CAUTION Call your doctor's office for any of the following:  . BP reading is greater than 140 (top number) or greater than 90 (bottom number)  . Stomach pain under your ribs in the middle or right side . Headaches or seeing spots . Feeling nauseated or throwing up . Swelling in face and hands  Zone 3: EMERGENCY  Seek immediate medical care if you have any of the following:  . BP reading is greater than160 (top number) or greater than 110 (bottom number) . Severe headaches not improving with Tylenol . Serious difficulty catching your breath . Any worsening symptoms from Zone 2    [place] Pediatricians/Family Doctors:  [facility] [phone]            [facility] [phone]                 [place] [phone] (usually not accepting new patients unless you have family there already, you are always welcome to call and ask)       [facility] Department [phone]       [facility] Pediatricians/Family Doctors:   [facility]: [phone]  [facility]: [phone]  [facility]: [place] Doctors:   [facility]: [facility]: [place]:  [place]: [phone]  Counseling referral sent to Adrienne Parker at [phone]

## 2019-09-27 ENCOUNTER — Other Ambulatory Visit: Payer: Self-pay | Admitting: *Deleted

## 2019-09-27 DIAGNOSIS — Z3400 Encounter for supervision of normal first pregnancy, unspecified trimester: Secondary | ICD-10-CM

## 2019-09-27 MED ORDER — BLOOD PRESSURE MONITOR MISC: 0 refills | Status: AC

## 2019-09-29 ENCOUNTER — Telehealth: Payer: Self-pay | Admitting: *Deleted

## 2019-09-29 NOTE — Telephone Encounter (Signed)
Patient states she is concerned regarding her blood pressure.  BP 135/90 and later recheck 138/93.  Would like a call back to discuss.   Spoke to patient who states she woke up not feeling well this morning. She does not have a blood pressure cuff at her home so she went to her grandmother's house and used hers.  BP as stated above.  She was experiencing a headache, took some tylenol which did resolve it and was seeing black spots but that has since resolved.  No other complaints.  Advised patient to check her blood pressure again tonight before she goes to bed and a couple of times tomorrow and to let us know if they are 150>90 or if her symptoms worse, she would need to go to [facility] for eval.  Pt verbalized understating and agreeable to plan.

## 2019-10-06 ENCOUNTER — Telehealth (INDEPENDENT_AMBULATORY_CARE_PROVIDER_SITE_OTHER): Payer: Medicaid Other | Admitting: Obstetrics and Gynecology

## 2019-10-06 ENCOUNTER — Encounter: Payer: Self-pay | Admitting: Obstetrics and Gynecology

## 2019-10-06 ENCOUNTER — Other Ambulatory Visit: Payer: Self-pay

## 2019-10-06 VITALS — BP 146/93 | HR 110 | Ht 62.0 in

## 2019-10-06 DIAGNOSIS — O0933 Supervision of pregnancy with insufficient antenatal care, third trimester: Secondary | ICD-10-CM | POA: Diagnosis not present

## 2019-10-06 DIAGNOSIS — F1290 Cannabis use, unspecified, uncomplicated: Secondary | ICD-10-CM

## 2019-10-06 DIAGNOSIS — Z3A35 35 weeks gestation of pregnancy: Secondary | ICD-10-CM

## 2019-10-06 DIAGNOSIS — Z3400 Encounter for supervision of normal first pregnancy, unspecified trimester: Secondary | ICD-10-CM

## 2019-10-06 NOTE — Progress Notes (Signed)
Patient ID: Adrienne Parker, female   DOB: 1996-03-08, 24 y.o.   MRN: [id number]    TELEHEALTH VIRTUAL OBSTETRICS VISIT ENCOUNTER NOTE  I connected with Adrienne Parker on 10/06/2019 at  3:00 PM EST by telephone at home and verified that I am speaking with the correct person using two identifiers.   I discussed the limitations, risks, security and privacy concerns of performing an evaluation and management service by telephone and the availability of in person appointments. I also discussed with the patient that there may be a patient responsible charge related to this service. The patient expressed understanding and agreed to proceed.  Subjective:  Adrienne Parker is a 24 y.o. G1P0000 at [redacted]w[redacted]d being followed for ongoing prenatal care.  She is currently monitored for the following issues for this low-risk pregnancy and has Attention deficit hyperactivity disorder (ADHD); Depression; Supervision of normal first pregnancy, antepartum; Late prenatal care in third trimester; Smoker; and Marijuana use on their problem list.  Patient reports no complaints. Reports fetal movement. Denies any contractions, bleeding or leaking of fluid.   The following portions of the patient's history were reviewed and updated as appropriate: allergies, current medications, past family history, past medical history, past social history, past surgical history and problem list.  No h/a scotoma , good fm, no ROM,   Objective:  bp was high on home test, went to ED / MAU, and it was fine at [facility] after observation. Better now. 148/89  General:  Alert, oriented and cooperative.   Mental Status: Normal mood and affect perceived. Normal judgment and thought content.  Rest of physical exam deferred due to type of encounter  Assessment and Plan:  Pregnancy: G1P0000 at [redacted]w[redacted]d  Preterm labor symptoms and general obstetric precautions including but not limited to vaginal bleeding, contractions, leaking of fluid and fetal movement were  reviewed in detail with the patient.  I discussed the assessment and treatment plan with the patient. The patient was provided an opportunity to ask questions and all were answered. The patient agreed with the plan and demonstrated an understanding of the instructions. The patient was advised to call back or seek an in-person office evaluation/go to [facility]U at [facility] for any urgent or concerning symptoms. Please refer to After Visit Summary for other counseling recommendations.   I provided 10 minutes of non-face-to-face time during this encounter.  Pt will notify us if BP's run over 140 or 90 diastolic in future , to call Friday if high.  No future appointments. will schedule 1 wk.for in-person.  By signing my name below, I, Arnette Norris, attest that this documentation has been prepared under the direction and in the presence of Tilda Burrow, MD. Electronically Signed: Arnette Norris Medical Scribe. 10/06/19. 3:58 PM.  I personally performed the services described in this documentation, which was SCRIBED in my presence. The recorded information has been reviewed and considered accurate. It has been edited as necessary during review. Tilda Burrow, MD

## 2019-10-06 NOTE — Patient Instructions (Signed)
[phone] is the phone number for Pregnancy Classes or hospital tours at [facility].   You will be referred to  [email or web address]   for more information on childbirth classes   At this site you may register for classes. You may sign up for a waiting list if classes are full. Please SIGN UP FOR THIS!.   When the waiting list becomes long, sometimes new classes can be added.  [facility] Call to Register: [phone] or [phone]   or   Register Online: [email or web address] THESE CLASSES FILL UP VERY QUICKLY, SO SIGN UP AS SOON AS YOU CAN!!! Please visit [facility] pregnancy website at [email or web address]  Childbirth Classes  Option 1: Birth & Baby Series ? Series of 3 weekly classes, on the same day of the week (can choose Mon-Thurs) from 6-9pm ? Helps you and your support person prepare for childbirth ? Reviews newborn care, labor & birth, cesarean birth, pain management, and comfort techniques ? Cost: $60 per couple for insured or self-pay, $30 per couple for Medicaid  Option 2: Weekend Birth & Baby ? This class is a weekend version of our Birth & Baby series.  It is designed for parents who have a difficult time fitting several weeks of classes into their schedule.   ? Covers the care of your newborn and the basics of labor and childbirth ? Friday 6:30pm-8:30pm Saturday 9am-4pm, includes lunch for you and your partner  ? Cost: $75 per couple for insured or self-pay, $30 per couple for Medicaid  Option 3: Natural Childbirth ? This series of 5 weekly classes is for expectant parents who want to learn and practice natural methods of coping with the process of labor and childbirth.  Can choose Mon or Tues, 7-9pm.   ? Covers relaxation, breathing, massage, visualization, role of the partner, and helpful positioning ? Participants learn how to be confident  in their body's ability to give birth. Class empowers and helps parents make informed decisions about care. Includes discussion that will help new parents transition into the immediate postpartum period.  ? Cost: $75 per couple for insured or self-pay, $30 per couple for Medicaid  Option 4: Online Birth & Baby ? This online class offers you the freedom to complete a Birth & Baby series in the comfort of your own home.  The flexibility of this option allows you to review sections at your own pace, at times convenient to you and your support people.  It includes additional video information, animations, quizzes and extended activities. Get organized with helpful eClass tools, checklists, and trackers.  ? Cost: $60 for 60 days of online access                                                                            Other Available Classes  Baby & Me Enjoy this time to discuss newborn & infant parenting topics and family adjustment issues with other new mothers in a relaxed environment. Each week brings a new speaker or baby-centered activity. We encourage mothers and their babies (birth to crawling) to join us. You are welcome to visit this group even if you haven't delivered yet! It's wonderful to make new friends early   and watch other moms interact with their babies. No registration or fee.  Big Brother/Big Sister Let your children share in the joy of a new brother or sister in this special class designed just for them. Discussion includes how families care for babies: swaddling, holding, diapering, safety, as well as how they can be helpful in their new role. This class is designed for children ages 2 to 6, but any age is welcome. Please register each child individually. $5 Breastfeeding Support Group This group is a mother-to-mother support circle where moms have the opportunity to share their breastfeeding experiences. A Breastfeeding Support nurse is present for questions and concerns. An infant  scale is available for weight checks. No fee or registration.  Breastfeeding Your Baby Breastfeeding is a special time for mother and child. This class will help you feel ready to begin this important relationship. Your partner is encouraged to attend with you. Learn what to expect and feel more confident in the first days of breastfeeding your newborn. This class also addresses the most common fears and challenges of breastfeeding during the first few weeks, months, and beyond. $30 per couple Caring for Baby This class is for expectant and adoptive parents who want to learn and practice the most up-to-date newborn care for their babies. Focus is on birth through first six weeks of life. Topics include feeding, bathing, diapering, crying, umbilical cord care, circumcision care and safe sleep. Parents learn how to recognize symptoms of illness and when to call the pediatrician. Register only the mom-to-be and your partner can plan to come with you. (*Note: This class is included in the Birth & Baby series and the Weekend Birth & Baby classes.) $10 per couple Comfort Techniques & Tour This 2-hour interactive class is designed for those who either do not wish to take the Birth & Baby series or for those who prefer our online childbirth class, but don't want to miss the opportunity to learn and practice hands-on techniques. These skills can help relieve some of the discomfort of labor and encourage your baby to rotate toward the best position for birth. You and your partner will be able to try a variety of labor positions with birth balls and rebozos as well as practice breathing, relaxation, and visual techniques. $20 per couple Daddy Boot Camp This course offers Dads-to-be the tools and knowledge needed to feel confident on their journey to becoming new fathers. Experienced dads, who have been trained as coaches, teach dads-to-be how to hold, comfort, diapers, swaddle and play with their infant while being  able to support the new mom as well. $25 Grandparent Love Expecting a grandbaby? Learn about the latest infant care and safety recommendations and ways to support your own child as he or she transitions into the parenting role. $10 per person Infant and Child CPR Parents, grandparents, babysitters, and friends learn Cardio-Pulmonary Resuscitation skills for infants and children. You will also learn how to treat both conscious and unconscious choking infants and children. Register each participant individually. (Note: This Family & Friends program does not offer certification.) $20 per person Marvelous Multiples Expecting twins, triplets, or more? This free 2-hour class covers the differences in labor, birth, parenting, and breastfeeding issues that face multiples' parents.  [facility]aternity [facility] Center Virtual Tour  Online virtual tour of the new [facility] [place]  Mom Talk This free mom-led group offers support and connection to mothers as they journey through the adjustments and struggles of that   sometimes overwhelming first year after the birth of a child. A member of our staff will be present to share resources and additional support if needed, as you care for yourself and baby. You are welcome to visit this group before you deliver! It's wonderful to meet new friends early and watch other moms interact with their babies.  Waterbirth Class Interested in a waterbirth? This free informational class will help you discover whether waterbirth is the right fit for you and is required if you are planning a waterbirth. Education about waterbirth itself, supplies you may need, and what you may need from your support team is included in this class. Partners are encouraged to come.    

## 2019-10-07 ENCOUNTER — Telehealth: Payer: Medicaid Other | Admitting: Advanced Practice Midwife

## 2019-10-08 ENCOUNTER — Other Ambulatory Visit: Payer: Self-pay

## 2019-10-08 ENCOUNTER — Observation Stay (HOSPITAL_COMMUNITY)
Admission: AD | Admit: 2019-10-08 | Discharge: 2019-10-09 | Disposition: A | Discharge status: Home / Self Care | Payer: Medicaid Other | Attending: Obstetrics & Gynecology | Admitting: Obstetrics & Gynecology

## 2019-10-08 ENCOUNTER — Encounter (HOSPITAL_COMMUNITY): Payer: Self-pay | Admitting: Obstetrics & Gynecology

## 2019-10-08 DIAGNOSIS — Z79899 Other long term (current) drug therapy: Secondary | ICD-10-CM | POA: Diagnosis not present

## 2019-10-08 DIAGNOSIS — Z20822 Contact with and (suspected) exposure to covid-19: Secondary | ICD-10-CM | POA: Insufficient documentation

## 2019-10-08 DIAGNOSIS — Z3A35 35 weeks gestation of pregnancy: Secondary | ICD-10-CM | POA: Insufficient documentation

## 2019-10-08 DIAGNOSIS — O99333 Smoking (tobacco) complicating pregnancy, third trimester: Secondary | ICD-10-CM | POA: Diagnosis not present

## 2019-10-08 DIAGNOSIS — F17210 Nicotine dependence, cigarettes, uncomplicated: Secondary | ICD-10-CM | POA: Diagnosis not present

## 2019-10-08 DIAGNOSIS — O133 Gestational [pregnancy-induced] hypertension without significant proteinuria, third trimester: Principal | ICD-10-CM | POA: Insufficient documentation

## 2019-10-08 DIAGNOSIS — F1290 Cannabis use, unspecified, uncomplicated: Secondary | ICD-10-CM

## 2019-10-08 DIAGNOSIS — O163 Unspecified maternal hypertension, third trimester: Secondary | ICD-10-CM | POA: Diagnosis present

## 2019-10-08 DIAGNOSIS — O0933 Supervision of pregnancy with insufficient antenatal care, third trimester: Secondary | ICD-10-CM

## 2019-10-08 LAB — COMPREHENSIVE METABOLIC PANEL
ALT: 16 U/L (ref 0–44)
AST: 17 U/L (ref 15–41)
Albumin: 2.9 g/dL — ABNORMAL LOW (ref 3.5–5.0)
Alkaline Phosphatase: 107 U/L (ref 38–126)
Anion gap: 13 (ref 5–15)
BUN: 6 mg/dL (ref 6–20)
CO2: 15 mmol/L — ABNORMAL LOW (ref 22–32)
Calcium: 8.8 mg/dL — ABNORMAL LOW (ref 8.9–10.3)
Chloride: 105 mmol/L (ref 98–111)
Creatinine, Ser: 0.54 mg/dL (ref 0.44–1.00)
GFR calc Af Amer: >60 mL/min (ref >60)
GFR calc non Af Amer: >60 mL/min (ref >60)
Glucose, Bld: 76 mg/dL (ref 70–99)
Potassium: 3.5 mmol/L (ref 3.5–5.1)
Sodium: 133 mmol/L — ABNORMAL LOW (ref 135–145)
Total Bilirubin: 0.9 mg/dL (ref 0.3–1.2)
Total Protein: 6.2 g/dL — ABNORMAL LOW (ref 6.5–8.1)

## 2019-10-08 LAB — CBC
HCT: 36.5 % (ref 36.0–46.0)
Hemoglobin: 12.7 g/dL (ref 12.0–15.0)
MCH: 32.2 pg (ref 26.0–34.0)
MCHC: 34.8 g/dL (ref 30.0–36.0)
MCV: 92.6 fL (ref 80.0–100.0)
Platelets: 274 10*3/uL (ref 150–400)
RBC: 3.94 MIL/uL (ref 3.87–5.11)
RDW: 13.2 % (ref 11.5–15.5)
WBC: 10.7 10*3/uL — ABNORMAL HIGH (ref 4.0–10.5)
nRBC: 0 % (ref 0.0–0.2)

## 2019-10-08 LAB — URINALYSIS, ROUTINE W REFLEX MICROSCOPIC
Bilirubin Urine: NEGATIVE
Glucose, UA: NEGATIVE mg/dL
Hgb urine dipstick: NEGATIVE
Ketones, ur: 80 mg/dL — AB
Leukocytes,Ua: NEGATIVE
Nitrite: NEGATIVE
Protein, ur: NEGATIVE mg/dL
Specific Gravity, Urine: 1.017 (ref 1.005–1.030)
pH: 6 (ref 5.0–8.0)

## 2019-10-08 LAB — PROTEIN / CREATININE RATIO, URINE
Creatinine, Urine: 166.21 mg/dL
Protein Creatinine Ratio: 0.13 mg/mg{Cre} (ref 0.00–0.15)
Total Protein, Urine: 21 mg/dL

## 2019-10-08 MED ORDER — BETAMETHASONE SOD PHOS & ACET 6 (3-3) MG/ML IJ SUSP
12.0000 mg | INTRAMUSCULAR | Status: DC
  Administered 2019-10-08: 19:00:00 12 mg via INTRAMUSCULAR
  Filled 2019-10-08: qty 5

## 2019-10-08 NOTE — MAU Provider Note (Signed)
History     [id number]  Arrival date and time: 10/08/19 1752    Chief Complaint  Patient presents with  . Hypertension  . seeing spots     HPI Adrienne Parker is a 24 y.o. at [redacted]w[redacted]d by 30wk [place] with PMHx notable for ADHD, tobacco use, depression, who presents for elevated BP.   Seen at [facility] on 10/06/2019, at that time BP was 140's/90's Today reports BP's have been elevated for past week or two After last visit at [facility] advised to call nurse line if cont to have elevated BP, did so today and instructed to present to MAU for evaluation BP's at home have been 140's/90's  Denies headache currently but had bad headache yesterday  Endorses having intermittent spots in her vision which started in the past week  Denies chest pain, SOB, RUQ pain, LE edema Has noted some puffiness to her hands  Vaginal bleeding: No LOF: No Fetal Movement: Yes Contractions: No  O/Positive/-- (01/06 1043)  OB History    Gravida  1   Para  0   Term  0   Preterm  0   AB  0   Living  0     SAB  0   TAB  0   Ectopic  0   Multiple  0   Live Births              Past Medical History:  Diagnosis Date  . ADD (attention deficit disorder)   . Anxiety   . Contraceptive management 09/20/2015  . Depression   . Encounter for Nexplanon removal 09/20/2015  . Hearing loss   . Irregular bleeding 09/20/2015    Past Surgical History:  Procedure Laterality Date  . TONSILLECTOMY AND ADENOIDECTOMY    . tubes in ears      Family History  Problem Relation Age of Onset  . Depression Mother   . Alcohol abuse Mother   . Drug abuse Mother   . Hearing loss Father   . Cancer Brother   . Alcohol abuse Brother   . Drug abuse Brother   . Stroke Maternal Aunt   . Diabetes Paternal Aunt   . Hypertension Maternal Grandfather   . Cancer Maternal Grandfather        prostate,skin  . Cancer Paternal Grandmother        colon  . Heart disease Other        maternal great grandma  . Mental illness  Brother   . Bipolar disorder Maternal Grandmother     Social History   Socioeconomic History  . Marital status: Single    Spouse name: Not on file  . Number of children: Not on file  . Years of education: Not on file  . Highest education level: GED or equivalent  Occupational History  . Not on file  Tobacco Use  . Smoking status: Current Every Day Smoker    Packs/day: 0.25    Years: 4.00    Pack years: 1.00    Types: Cigarettes  . Smokeless tobacco: Never Used  . Tobacco comment: "5 cigs per day"  Substance and Sexual Activity  . Alcohol use: Not Currently    Comment: rarely  . Drug use: Not Currently    Types: Marijuana  . Sexual activity: Yes    Birth control/protection: None  Other Topics Concern  . Not on file  Social History Narrative  . Not on file   Social Determinants of Health   Financial Resource  Strain:   . Difficulty of Paying Living Expenses: Not on file  Food Insecurity:   . Worried About Charity fundraiser in the Last Year: Not on file  . Ran Out of Food in the Last Year: Not on file  Transportation Needs:   . Lack of Transportation (Medical): Not on file  . Lack of Transportation (Non-Medical): Not on file  Physical Activity:   . Days of Exercise per Week: Not on file  . Minutes of Exercise per Session: Not on file  Stress:   . Feeling of Stress : Not on file  Social Connections:   . Frequency of Communication with Friends and Family: Not on file  . Frequency of Social Gatherings with Friends and Family: Not on file  . Attends Religious Services: Not on file  . Active Member of Clubs or Organizations: Not on file  . Attends Archivist Meetings: Not on file  . Marital Status: Not on file  Intimate Partner Violence:   . Fear of Current or Ex-Partner: Not on file  . Emotionally Abused: Not on file  . Physically Abused: Not on file  . Sexually Abused: Not on file    No Known Allergies  No current facility-administered medications  on file prior to encounter.   Current Outpatient Medications on File Prior to Encounter  Medication Sig Dispense Refill  . Prenatal Vit-Fe Fumarate-FA (PNV PRENATAL PLUS MULTIVITAMIN) 27-1 MG TABS Take 1 tablet by mouth daily. 30 tablet 11  . Blood Pressure Monitor MISC For regular home bp monitoring during pregnancy 1 each 0     ROS Complete ROS completed and otherwise negative except as noted in HPI  Physical Exam   BP 131/86 (BP Location: Left Arm)   Pulse (!) 116   Temp 98.2 F (36.8 C) (Oral)   Resp 18   Ht 5\' 2"  (1.575 m)   Wt 81 kg   SpO2 98%   BMI 32.65 kg/m   Physical Exam  Vitals reviewed. Constitutional: She appears well-developed and well-nourished. No distress.  Eyes: No scleral icterus.  Cardiovascular: Regular rhythm.  Respiratory: Effort normal. No respiratory distress.  GI: Soft. She exhibits no distension. There is no abdominal tenderness. There is no rebound and no guarding.  Musculoskeletal:        General: No edema.  Neurological: She is alert. Coordination normal.  Skin: Skin is warm and dry. She is not diaphoretic.  Psychiatric: She has a normal mood and affect.    Bedside Ultrasound Not done  FHT Baseline 140, moderate variability, +accels, -decels Toco : irritability Cat I, reactive NST  Labs Results for orders placed or performed during the hospital encounter of 10/08/19 (from the past 24 hour(s))  Urinalysis, Routine w reflex microscopic     Status: Abnormal   Collection Time: 10/08/19  6:10 PM  Result Value Ref Range   Color, Urine YELLOW YELLOW   APPearance CLEAR CLEAR   Specific Gravity, Urine 1.017 1.005 - 1.030   pH 6.0 5.0 - 8.0   Glucose, UA NEGATIVE NEGATIVE mg/dL   Hgb urine dipstick NEGATIVE NEGATIVE   Bilirubin Urine NEGATIVE NEGATIVE   Ketones, ur 80 (A) NEGATIVE mg/dL   Protein, ur NEGATIVE NEGATIVE mg/dL   Nitrite NEGATIVE NEGATIVE   Leukocytes,Ua NEGATIVE NEGATIVE  CBC     Status: Abnormal   Collection Time:  10/08/19  7:01 PM  Result Value Ref Range   WBC 10.7 (H) 4.0 - 10.5 K/uL   RBC 3.94 3.87 -  5.11 MIL/uL   Hemoglobin 12.7 12.0 - 15.0 g/dL   HCT 01.7 49.4 - 49.6 %   MCV 92.6 80.0 - 100.0 fL   MCH 32.2 26.0 - 34.0 pg   MCHC 34.8 30.0 - 36.0 g/dL   RDW 75.9 16.3 - 84.6 %   Platelets 274 150 - 400 K/uL   nRBC 0.0 0.0 - 0.2 %  Comprehensive metabolic panel     Status: Abnormal   Collection Time: 10/08/19  7:01 PM  Result Value Ref Range   Sodium 133 (L) 135 - 145 mmol/L   Potassium 3.5 3.5 - 5.1 mmol/L   Chloride 105 98 - 111 mmol/L   CO2 15 (L) 22 - 32 mmol/L   Glucose, Bld 76 70 - 99 mg/dL   BUN 6 6 - 20 mg/dL   Creatinine, Ser 6.59 0.44 - 1.00 mg/dL   Calcium 8.8 (L) 8.9 - 10.3 mg/dL   Total Protein 6.2 (L) 6.5 - 8.1 g/dL   Albumin 2.9 (L) 3.5 - 5.0 g/dL   AST 17 15 - 41 U/L   ALT 16 0 - 44 U/L   Alkaline Phosphatase 107 38 - 126 U/L   Total Bilirubin 0.9 0.3 - 1.2 mg/dL   GFR calc non Af Amer >60 >60 mL/min   GFR calc Af Amer >60 >60 mL/min   Anion gap 13 5 - 15    Imaging Not obtained  MAU Course  Procedures CBC CMP UPCR Betamethasone IM  MDM Moderate  Assessment and Plan  #PIH Patient presenting with vague visual symptoms in setting of confirmed PIH with two mild range BP's on two separate days. CBC and CMP reassuring, UPCR still pending, diagnosis either gestational hypertension vs Pre-eclmapsia +/- severe features w possible scotoma. Case discussed w Dr. Despina Hidden, plan to admit to [facility] and given course of betamethasone, observe BP and symptoms. Pending progress dispo cont with induction vs home. Patient amenable to this plan.   #FWB FHT Cat I NST reactive  Venora Maples

## 2019-10-08 NOTE — MAU Note (Signed)
BP has been running high the last wk or two.  Nurse advised her to come here.  141/93 at home.  No HA today, (had a really really bad one yesterday), is seeing spots, denies epigastric pain,  Wakes up with swollen tingly hands.

## 2019-10-09 DIAGNOSIS — Z3A30 30 weeks gestation of pregnancy: Secondary | ICD-10-CM | POA: Diagnosis not present

## 2019-10-09 DIAGNOSIS — O163 Unspecified maternal hypertension, third trimester: Secondary | ICD-10-CM | POA: Diagnosis not present

## 2019-10-09 LAB — SARS CORONAVIRUS 2 (TAT 6-24 HRS): SARS Coronavirus 2: NEGATIVE

## 2019-10-09 MED ORDER — CALCIUM CARBONATE ANTACID 500 MG PO CHEW
1.0000 | CHEWABLE_TABLET | Freq: Once | ORAL | Status: AC
  Administered 2019-10-09: 200 mg via ORAL
  Filled 2019-10-09: qty 1

## 2019-10-09 NOTE — Discharge Summary (Signed)
Physician Discharge Summary  Patient ID: Adrienne Parker MRN: [id number] DOB/AGE: August 13, 1996 23 y.o.  Admit date: 10/08/2019 Discharge date: 10/09/2019  Admission Diagnoses:[place] with borderline BPs  Discharge Diagnoses:  Active Problems:   Hypertension affecting pregnancy in third trimester   Discharged Condition: good  Hospital Course: pt admitted for observation of BP and symptoms resolved and BP is normal Will have close follow up in office in 2 days for re evaluation  Consults: None  Significant Diagnostic Studies: labs:   Treatments: observation  Discharge Exam: Blood pressure 130/86, pulse 91, temperature 98 F (36.7 C), temperature source Oral, resp. rate 20, height 5\' 2"  (1.575 m), weight 81 kg, SpO2 99 %. General appearance: alert, cooperative and no distress GI: soft, non-tender; bowel sounds normal; no masses,  no organomegaly Extremities: extremities normal, atraumatic, no cyanosis or edema and DTRs 2+  Disposition: Discharge disposition: 01-Home or Self Care       Discharge Instructions    Diet - low sodium heart healthy   Complete by: As directed    Increase activity slowly   Complete by: As directed      Allergies as of 10/09/2019   No Known Allergies     Medication List    TAKE these medications   Blood Pressure Monitor Misc For regular home bp monitoring during pregnancy   PNV Prenatal Plus Multivitamin 27-1 MG Tabs Take 1 tablet by mouth daily.      Follow-up Information    [facility] Follow up on 10/11/2019.   Specialty: Obstetrics and Gynecology Why: OB visit, NST, betamethasone injection Contact information: [address] [place] [phone]          Signed: [phone] 10/09/2019, 8:51 AM

## 2019-10-09 NOTE — Discharge Instructions (Signed)
Preeclampsia and Eclampsia Preeclampsia is a serious condition that may develop during pregnancy. This condition causes high blood pressure and increased protein in your urine along with other symptoms, such as headaches and vision changes. These symptoms may develop as the condition gets worse. Preeclampsia may occur at 20 weeks of pregnancy or later. Diagnosing and treating preeclampsia early is very important. If not treated early, it can cause serious problems for you and your baby. One problem it can lead to is eclampsia. Eclampsia is a condition that causes muscle jerking or shaking (convulsions or seizures) and other serious problems for the mother. During pregnancy, delivering your baby may be the best treatment for preeclampsia or eclampsia. For most women, preeclampsia and eclampsia symptoms go away after giving birth. In rare cases, a woman may develop preeclampsia after giving birth (postpartum preeclampsia). This usually occurs within 48 hours after childbirth but may occur up to 6 weeks after giving birth. What are the causes? The cause of preeclampsia is not known. What increases the risk? The following risk factors make you more likely to develop preeclampsia:  Being pregnant for the first time.  Having had preeclampsia during a past pregnancy.  Having a family history of preeclampsia.  Having high blood pressure.  Being pregnant with more than one baby.  Being 35 or older.  Being African-American.  Having kidney disease or diabetes.  Having medical conditions such as lupus or blood diseases.  Being very overweight (obese). What are the signs or symptoms? The most common symptoms are:  Severe headaches.  Vision problems, such as blurred or double vision.  Abdominal pain, especially upper abdominal pain. Other symptoms that may develop as the condition gets worse include:  Sudden weight gain.  Sudden swelling of the hands, face, legs, and feet.  Severe nausea  and vomiting.  Numbness in the face, arms, legs, and feet.  Dizziness.  Urinating less than usual.  Slurred speech.  Convulsions or seizures. How is this diagnosed? There are no screening tests for preeclampsia. Your health care provider will ask you about symptoms and check for signs of preeclampsia during your prenatal visits. You may also have tests that include:  Checking your blood pressure.  Urine tests to check for protein. Your health care provider will check for this at every prenatal visit.  Blood tests.  Monitoring your baby's heart rate.  Ultrasound. How is this treated? You and your health care provider will determine the treatment approach that is best for you. Treatment may include:  Having more frequent prenatal exams to check for signs of preeclampsia, if you have an increased risk for preeclampsia.  Medicine to lower your blood pressure.  Staying in the hospital, if your condition is severe. There, treatment will focus on controlling your blood pressure and the amount of fluids in your body (fluid retention).  Taking medicine (magnesium sulfate) to prevent seizures. This may be given as an injection or through an IV.  Taking a low-dose aspirin during your pregnancy.  Delivering your baby early. You may have your labor started with medicine (induced), or you may have a cesarean delivery. Follow these instructions at home: Eating and drinking   Drink enough fluid to keep your urine pale yellow.  Avoid caffeine. Lifestyle  Do not use any products that contain nicotine or tobacco, such as cigarettes and e-cigarettes. If you need help quitting, ask your health care provider.  Do not use alcohol or drugs.  Avoid stress as much as possible. Rest and get   plenty of sleep. General instructions  Take over-the-counter and prescription medicines only as told by your health care provider.  When lying down, lie on your left side. This keeps pressure off your  major blood vessels.  When sitting or lying down, raise (elevate) your feet. Try putting some pillows underneath your lower legs.  Exercise regularly. Ask your health care provider what kinds of exercise are best for you.  Keep all follow-up and prenatal visits as told by your health care provider. This is important. How is this prevented? There is no known way of preventing preeclampsia or eclampsia from developing. However, to lower your risk of complications and detect problems early:  Get regular prenatal care. Your health care provider may be able to diagnose and treat the condition early.  Maintain a healthy weight. Ask your health care provider for help managing weight gain during pregnancy.  Work with your health care provider to manage any long-term (chronic) health conditions you have, such as diabetes or kidney problems.  You may have tests of your blood pressure and kidney function after giving birth.  Your health care provider may have you take low-dose aspirin during your next pregnancy. Contact a health care provider if:  You have symptoms that your health care provider told you may require more treatment or monitoring, such as: ? Headaches. ? Nausea or vomiting. ? Abdominal pain. ? Dizziness. ? Light-headedness. Get help right away if:  You have severe: ? Abdominal pain. ? Headaches that do not get better. ? Dizziness. ? Vision problems. ? Confusion. ? Nausea or vomiting.  You have any of the following: ? A seizure. ? Sudden, rapid weight gain. ? Sudden swelling in your hands, ankles, or face. ? Trouble moving any part of your body. ? Numbness in any part of your body. ? Trouble speaking. ? Abnormal bleeding.  You faint. Summary  Preeclampsia is a serious condition that may develop during pregnancy.  This condition causes high blood pressure and increased protein in your urine along with other symptoms, such as headaches and vision  changes.  Diagnosing and treating preeclampsia early is very important. If not treated early, it can cause serious problems for you and your baby.  Get help right away if you have symptoms that your health care provider told you to watch for. This information is not intended to replace advice given to you by your health care provider. Make sure you discuss any questions you have with your health care provider. Document Revised: 04/07/2018 Document Reviewed: 03/11/2016 Elsevier Patient Education  2020 [employer]  

## 2019-10-09 NOTE — Progress Notes (Signed)
Discharge instructions given to patient. Discussed follow up appointment for NST and BMZ shot.  Reviewed signs and symptoms of hypertension. No medications added.  Patient verbalized understanding.

## 2019-10-10 NOTE — H&P (Signed)
History  [id number]  Arrival date and time: 10/08/19 1752     Chief Complaint  Patient presents with  . Hypertension  . seeing spots   HPI  Adrienne Parker is a 24 y.o. at [redacted]w[redacted]d by 30wk [place] with PMHx notable for ADHD, tobacco use, depression, who presents for elevated BP.  Seen at [facility] on 10/06/2019, at that time BP was 140's/90's  Today reports BP's have been elevated for past week or two  After last visit at [facility] advised to call nurse line if cont to have elevated BP, did so today and instructed to present to MAU for evaluation  BP's at home have been 140's/90's  Denies headache currently but had bad headache yesterday  Endorses having intermittent spots in her vision which started in the past week  Denies chest pain, SOB, RUQ pain, LE edema  Has noted some puffiness to her hands  Vaginal bleeding: No  LOF: No  Fetal Movement: Yes  Contractions: No  O/Positive/-- (01/06 1043)          OB History     Gravida Para Term Preterm AB Living   1 0 0 0 0 0    SAB TAB Ectopic Multiple Live Births    0 0 0 0            Past Medical History:  Diagnosis Date  . ADD (attention deficit disorder)   . Anxiety   . Contraceptive management 09/20/2015  . Depression   . Encounter for Nexplanon removal 09/20/2015  . Hearing loss   . Irregular bleeding 09/20/2015        Past Surgical History:  Procedure Laterality Date  . TONSILLECTOMY AND ADENOIDECTOMY    . tubes in ears          Family History  Problem Relation Age of Onset  . Depression Mother   . Alcohol abuse Mother   . Drug abuse Mother   . Hearing loss Father   . Cancer Brother   . Alcohol abuse Brother   . Drug abuse Brother   . Stroke Maternal Aunt   . Diabetes Paternal Aunt   . Hypertension Maternal Grandfather   . Cancer Maternal Grandfather    prostate,skin  . Cancer Paternal Grandmother    colon  . Heart disease Other    maternal great grandma  . Mental illness Brother   . Bipolar disorder Maternal  Grandmother    Social History        Socioeconomic History  . Marital status: Single    Spouse name: Not on file  . Number of children: Not on file  . Years of education: Not on file  . Highest education level: GED or equivalent  Occupational History  . Not on file  Tobacco Use  . Smoking status: Current Every Day Smoker    Packs/day: 0.25    Years: 4.00    Pack years: 1.00    Types: Cigarettes  . Smokeless tobacco: Never Used  . Tobacco comment: "5 cigs per day"  Substance and Sexual Activity  . Alcohol use: Not Currently    Comment: rarely  . Drug use: Not Currently    Types: Marijuana  . Sexual activity: Yes    Birth control/protection: None  Other Topics Concern  . Not on file  Social History Narrative  . Not on file   Social Determinants of Health      Financial Resource Strain:   . Difficulty of Paying Living Expenses: Not on  file  Food Insecurity:   . Worried About Programme researcher, broadcasting/film/video in the Last Year: Not on file  . Ran Out of Food in the Last Year: Not on file  Transportation Needs:   . Lack of Transportation (Medical): Not on file  . Lack of Transportation (Non-Medical): Not on file  Physical Activity:   . Days of Exercise per Week: Not on file  . Minutes of Exercise per Session: Not on file  Stress:   . Feeling of Stress : Not on file  Social Connections:   . Frequency of Communication with Friends and Family: Not on file  . Frequency of Social Gatherings with Friends and Family: Not on file  . Attends Religious Services: Not on file  . Active Member of Clubs or Organizations: Not on file  . Attends Banker Meetings: Not on file  . Marital Status: Not on file  Intimate Partner Violence:   . Fear of Current or Ex-Partner: Not on file  . Emotionally Abused: Not on file  . Physically Abused: Not on file  . Sexually Abused: Not on file   No Known Allergies  No current facility-administered medications on file prior to encounter.          Current Outpatient Medications on File Prior to Encounter  Medication Sig Dispense Refill  . Prenatal Vit-Fe Fumarate-FA (PNV PRENATAL PLUS MULTIVITAMIN) 27-1 MG TABS Take 1 tablet by mouth daily. 30 tablet 11  . Blood Pressure Monitor MISC For regular home bp monitoring during pregnancy 1 each 0   ROS  Complete ROS completed and otherwise negative except as noted in HPI  Physical Exam  BP 131/86 (BP Location: Left Arm)  Pulse (!) 116  Temp 98.2 F (36.8 C) (Oral)  Resp 18  Ht 5\' 2"  (1.575 m)  Wt 81 kg  SpO2 98%  BMI 32.65 kg/m  Physical Exam  Vitals reviewed.  Constitutional: She appears well-developed and well-nourished. No distress.  Eyes: No scleral icterus.  Cardiovascular: Regular rhythm.  Respiratory: Effort normal. No respiratory distress.  GI: Soft. She exhibits no distension. There is no abdominal tenderness. There is no rebound and no guarding.  Musculoskeletal:  General: No edema.  Neurological: She is alert. Coordination normal.  Skin: Skin is warm and dry. She is not diaphoretic.  Psychiatric: She has a normal mood and affect.   Bedside Ultrasound  Not done  FHT  Baseline 140, moderate variability, +accels, -decels  Toco : irritability  Cat I, reactive NST  Labs       Results for orders placed or performed during the hospital encounter of 10/08/19 (from the past 24 hour(s))  Urinalysis, Routine w reflex microscopic Status: Abnormal   Collection Time: 10/08/19 6:10 PM  Result Value Ref Range   Color, Urine YELLOW YELLOW   APPearance CLEAR CLEAR   Specific Gravity, Urine 1.017 1.005 - 1.030   pH 6.0 5.0 - 8.0   Glucose, UA NEGATIVE NEGATIVE mg/dL   Hgb urine dipstick NEGATIVE NEGATIVE   Bilirubin Urine NEGATIVE NEGATIVE   Ketones, ur 80 (A) NEGATIVE mg/dL   Protein, ur NEGATIVE NEGATIVE mg/dL   Nitrite NEGATIVE NEGATIVE   Leukocytes,Ua NEGATIVE NEGATIVE  CBC Status: Abnormal   Collection Time: 10/08/19 7:01 PM  Result Value Ref Range   WBC  10.7 (H) 4.0 - 10.5 K/uL   RBC 3.94 3.87 - 5.11 MIL/uL   Hemoglobin 12.7 12.0 - 15.0 g/dL   HCT 10/10/19 23.7 - 62.8 %   MCV  92.6 80.0 - 100.0 fL   MCH 32.2 26.0 - 34.0 pg   MCHC 34.8 30.0 - 36.0 g/dL   RDW 41.9 37.9 - 02.4 %   Platelets 274 150 - 400 K/uL   nRBC 0.0 0.0 - 0.2 %  Comprehensive metabolic panel Status: Abnormal   Collection Time: 10/08/19 7:01 PM  Result Value Ref Range   Sodium 133 (L) 135 - 145 mmol/L   Potassium 3.5 3.5 - 5.1 mmol/L   Chloride 105 98 - 111 mmol/L   CO2 15 (L) 22 - 32 mmol/L   Glucose, Bld 76 70 - 99 mg/dL   BUN 6 6 - 20 mg/dL   Creatinine, Ser 0.97 0.44 - 1.00 mg/dL   Calcium 8.8 (L) 8.9 - 10.3 mg/dL   Total Protein 6.2 (L) 6.5 - 8.1 g/dL   Albumin 2.9 (L) 3.5 - 5.0 g/dL   AST 17 15 - 41 U/L   ALT 16 0 - 44 U/L   Alkaline Phosphatase 107 38 - 126 U/L   Total Bilirubin 0.9 0.3 - 1.2 mg/dL   GFR calc non Af Amer >60 >60 mL/min   GFR calc Af Amer >60 >60 mL/min   Anion gap 13 5 - 15   Imaging  Not obtained  MAU Course  Procedures  CBC  CMP  UPCR  Betamethasone IM  MDM  Moderate  Assessment and Plan  #PIH  Patient presenting with vague visual symptoms in setting of confirmed PIH with two mild range BP's on two separate days. CBC and CMP reassuring, UPCR still pending, diagnosis either gestational hypertension vs Pre-eclmapsia +/- severe features w possible scotoma. Case discussed w Dr. Despina Hidden, plan to admit to [facility] and given course of betamethasone, observe BP and symptoms. Pending progress dispo cont with induction vs home. Patient amenable to this plan.  #FWB  FHT Cat I  NST reactive  Venora Maples

## 2019-10-11 ENCOUNTER — Encounter: Payer: Self-pay | Admitting: Obstetrics and Gynecology

## 2019-10-11 ENCOUNTER — Ambulatory Visit (INDEPENDENT_AMBULATORY_CARE_PROVIDER_SITE_OTHER): Payer: Medicaid Other | Admitting: Obstetrics and Gynecology

## 2019-10-11 ENCOUNTER — Other Ambulatory Visit: Payer: Self-pay

## 2019-10-11 VITALS — BP 136/89 | HR 113 | Wt 183.6 lb

## 2019-10-11 DIAGNOSIS — F1290 Cannabis use, unspecified, uncomplicated: Secondary | ICD-10-CM | POA: Diagnosis not present

## 2019-10-11 DIAGNOSIS — O99323 Drug use complicating pregnancy, third trimester: Secondary | ICD-10-CM | POA: Diagnosis not present

## 2019-10-11 DIAGNOSIS — O0933 Supervision of pregnancy with insufficient antenatal care, third trimester: Secondary | ICD-10-CM

## 2019-10-11 DIAGNOSIS — O133 Gestational [pregnancy-induced] hypertension without significant proteinuria, third trimester: Secondary | ICD-10-CM | POA: Diagnosis not present

## 2019-10-11 DIAGNOSIS — Z3A35 35 weeks gestation of pregnancy: Secondary | ICD-10-CM

## 2019-10-11 DIAGNOSIS — Z331 Pregnant state, incidental: Secondary | ICD-10-CM

## 2019-10-11 DIAGNOSIS — O163 Unspecified maternal hypertension, third trimester: Secondary | ICD-10-CM

## 2019-10-11 DIAGNOSIS — Z1389 Encounter for screening for other disorder: Secondary | ICD-10-CM

## 2019-10-11 DIAGNOSIS — Z3400 Encounter for supervision of normal first pregnancy, unspecified trimester: Secondary | ICD-10-CM

## 2019-10-11 LAB — POCT URINALYSIS DIPSTICK OB
Blood, UA: NEGATIVE
Glucose, UA: NEGATIVE
Ketones, UA: NEGATIVE
Leukocytes, UA: NEGATIVE
Nitrite, UA: NEGATIVE
POC,PROTEIN,UA: NEGATIVE

## 2019-10-11 MED ORDER — BETAMETHASONE SOD PHOS & ACET 6 (3-3) MG/ML IJ SUSP
12.0000 mg | Freq: Once | INTRAMUSCULAR | Status: AC
  Administered 2019-10-11: 17:00:00 12 mg via INTRAMUSCULAR

## 2019-10-11 NOTE — Progress Notes (Addendum)
Patient ID: Otho Ket, female   DOB: 1996-03-06, 24 y.o.   MRN: [id number]    [facility] PREGNANCY VISIT Patient name: Adrienne Parker MRN [id number]  Date of birth: Jul 04, 1996 Chief Complaint:   Routine Prenatal Visit (NST; Betamethasone 12 mg)  History of Present Illness:   Adrienne Parker is a 25 y.o. G57P0000 female at [redacted]w[redacted]d with an Estimated Date of Delivery: 11/09/19 being seen today for ongoing management of a high-risk pregnancy complicated by gestational hypertension currently on no meds. She was seen at [facility] on 10/08/2019 for her high BP. She was given betamethasone. She received her second dose today in office.  She denies any headaches, blurry vision, or abdominal pain Today she reports no complaints. Contractions: Not present. Vag. Bleeding: None.  Movement: Present. denies leaking of fluid.  Review of Systems:   Pertinent items are noted in HPI Denies abnormal vaginal discharge w/ itching/odor/irritation, headaches, visual changes, shortness of breath, chest pain, abdominal pain, severe nausea/vomiting, or problems with urination or bowel movements unless otherwise stated above. Pertinent History Reviewed:  Reviewed past medical,surgical, social, obstetrical and family history.  Reviewed problem list, medications and allergies. Physical Assessment:   Vitals:   10/11/19 1626  BP: 136/89  Pulse: (!) 113  Weight: 183 lb 9.6 oz (83.3 kg)  Body mass index is 33.58 kg/m.           Physical Examination:   General appearance: alert, well appearing, and in no distress  Mental status: alert, oriented to person, place, and time, normal mood, behavior, speech, dress, motor activity, and thought processes, affect appropriate to mood  Skin: warm & dry   Extremities: Edema: Trace    Cardiovascular: normal heart rate noted  Respiratory: normal respiratory effort, no distress  Abdomen: gravid, soft, non-tender  Pelvic: Cervical exam deferred         Fetal Status:     Movement: Present     Fetal Surveillance Testing today: NST reactive  Chaperone: Nikki Dom    Results for orders placed or performed in visit on 10/11/19 (from the past 24 hour(s))  POC Urinalysis Dipstick OB   Collection Time: 10/11/19  4:27 PM  Result Value Ref Range   Color, UA     Clarity, UA     Glucose, UA Negative Negative   Bilirubin, UA     Ketones, UA neg    Spec Grav, UA     Blood, UA neg    pH, UA     POC,PROTEIN,UA Negative Negative, Trace, Small (1+), Moderate (2+), Large (3+), 4+   Urobilinogen, UA     Nitrite, UA neg    Leukocytes, UA Negative Negative   Appearance     Odor      Assessment & Plan:  1) High-risk pregnancy G1P0000 at [redacted]w[redacted]d with an Estimated Date of Delivery: 11/09/19   2) GHTN, stable  Meds:  Meds ordered this encounter  Medications  . betamethasone acetate-betamethasone sodium phosphate (CELESTONE) injection 12 mg    Labs/procedures today: NST reactive  Treatment Plan:  F/u in 3-4 days NST  Reviewed: Preterm labor symptoms and general obstetric precautions including but not limited to vaginal bleeding, contractions, leaking of fluid and fetal movement were reviewed in detail with the patient.  All questions were answered. Has home bp cuff. Check bp weekly, let us know if >140/90.   Follow-up: Return in about 4 days (around 10/15/2019) for NST.  Orders Placed This Encounter  Procedures  . POC Urinalysis Dipstick OB   By  signing my name below, I, Clerance Lav, attest that this documentation has been prepared under the direction and in the presence of Jonnie Kind, MD. Electronically Signed: [place]. 10/11/19. 5:05 PM.  I personally performed the services described in this documentation, which was SCRIBED in my presence. The recorded information has been reviewed and considered accurate. It has been edited as necessary during review. Jonnie Kind, MD

## 2019-10-13 ENCOUNTER — Other Ambulatory Visit (HOSPITAL_COMMUNITY)
Admission: RE | Admit: 2019-10-13 | Discharge: 2019-10-13 | Disposition: A | Discharge status: Home / Self Care | Payer: Medicaid Other | Source: Ambulatory Visit | Attending: Obstetrics and Gynecology | Admitting: Obstetrics and Gynecology

## 2019-10-13 ENCOUNTER — Other Ambulatory Visit: Payer: Self-pay

## 2019-10-13 ENCOUNTER — Encounter: Payer: Self-pay | Admitting: Obstetrics and Gynecology

## 2019-10-13 ENCOUNTER — Ambulatory Visit (INDEPENDENT_AMBULATORY_CARE_PROVIDER_SITE_OTHER): Payer: Medicaid Other | Admitting: Obstetrics and Gynecology

## 2019-10-13 VITALS — BP 141/91 | HR 124 | Wt 181.4 lb

## 2019-10-13 DIAGNOSIS — O0990 Supervision of high risk pregnancy, unspecified, unspecified trimester: Secondary | ICD-10-CM | POA: Diagnosis present

## 2019-10-13 DIAGNOSIS — O98313 Other infections with a predominantly sexual mode of transmission complicating pregnancy, third trimester: Secondary | ICD-10-CM

## 2019-10-13 DIAGNOSIS — Z1389 Encounter for screening for other disorder: Secondary | ICD-10-CM

## 2019-10-13 DIAGNOSIS — Z331 Pregnant state, incidental: Secondary | ICD-10-CM

## 2019-10-13 DIAGNOSIS — O133 Gestational [pregnancy-induced] hypertension without significant proteinuria, third trimester: Secondary | ICD-10-CM

## 2019-10-13 DIAGNOSIS — Z3A36 36 weeks gestation of pregnancy: Secondary | ICD-10-CM

## 2019-10-13 DIAGNOSIS — A630 Anogenital (venereal) warts: Secondary | ICD-10-CM

## 2019-10-13 DIAGNOSIS — Z23 Encounter for immunization: Secondary | ICD-10-CM | POA: Diagnosis not present

## 2019-10-13 LAB — POCT URINALYSIS DIPSTICK OB
Blood, UA: NEGATIVE
Glucose, UA: NEGATIVE
Ketones, UA: NEGATIVE
Leukocytes, UA: NEGATIVE
Nitrite, UA: NEGATIVE

## 2019-10-13 NOTE — Progress Notes (Signed)
Patient ID: Adrienne Parker, female   DOB: May 18, 1996, 24 y.o.   MRN: [id number]    [facility] PREGNANCY VISIT Patient name: Adrienne Parker MRN [id number]  Date of birth: 1995/11/23 Chief Complaint:   High Risk Gestation (GBS; GC/CHL)  History of Present Illness:   Adrienne Parker is a 24 y.o. G1P0000 female at [redacted]w[redacted]d with an Estimated Date of Delivery: 11/09/19 being seen today for ongoing management of a high-risk pregnancy complicated by gestational hypertension currently on no medications. Today she reports no complaints. Contractions: Not present. Vag. Bleeding: None.  Movement: Present. denies leaking of fluid. Denies headaches and blurry vision.  Review of Systems:   Pertinent items are noted in HPI Denies abnormal vaginal discharge w/ itching/odor/irritation, headaches, visual changes, shortness of breath, chest pain, abdominal pain, severe nausea/vomiting, or problems with urination or bowel movements unless otherwise stated above. Pertinent History Reviewed:  Reviewed past medical,surgical, social, obstetrical and family history.  Reviewed problem list, medications and allergies. Physical Assessment:   Vitals:   10/13/19 1353  BP: (!) 141/91  Pulse: (!) 124  Weight: 181 lb 6.4 oz (82.3 kg)  Body mass index is 33.18 kg/m.           Physical Examination:   General appearance: alert, well appearing, and in no distress and oriented to person, place, and time  Mental status: normal mood, behavior, speech, dress, motor activity, and thought processes, affect appropriate to mood  Skin: warm & dry   Extremities: Edema: Trace 2-3+ reflexes without clonus   Cardiovascular: normal heart rate noted  Respiratory: normal respiratory effort, no distress  Abdomen: gravid, soft, non-tender  Pelvic: Cervical exam performed perineal condyloma. Cervix appears normal. 0.5 cm dilated. Head is low.  Rectal:  Dilation: Fingertip      Fetal Status: Fetal Heart Rate (bpm):  152 Fundal Height: 35 cm Movement: Present    Fetal Surveillance Testing today: FHT   Chaperone: Nikki Dom    Results for orders placed or performed in visit on 10/13/19 (from the past 24 hour(s))  POC Urinalysis Dipstick OB   Collection Time: 10/13/19  1:54 PM  Result Value Ref Range   Color, UA     Clarity, UA     Glucose, UA Negative Negative   Bilirubin, UA     Ketones, UA neg    Spec Grav, UA     Blood, UA neg    pH, UA     POC,PROTEIN,UA Trace Negative, Trace, Small (1+), Moderate (2+), Large (3+), 4+   Urobilinogen, UA     Nitrite, UA neg    Leukocytes, UA Negative Negative   Appearance     Odor      Assessment & Plan:  1) High-risk pregnancy G1P0000 at [redacted]w[redacted]d with an Estimated Date of Delivery: 11/09/19   2) Gestational HTN, stable  3) Peri anal and vulvar condyloma plan to remove after delivery.and postpartum care.  Meds: No orders of the defined types were placed in this encounter.  Labs/procedures today: TDAP, GBS/GCCHL  Treatment Plan: Continue routine obstetric care. The patient will be induced on 10/21/2019. Plan to remove condyloma. Patient will return in two days for NST.  Reviewed: Preterm labor symptoms and general obstetric precautions including but not limited to vaginal bleeding, contractions, leaking of fluid and fetal movement were reviewed in detail with the patient.  All questions were answered. Has home bp cuff. Check bp weekly, let us know if >140/90.   Follow-up: Return in about  2 days (around 10/15/2019) for HROB, NST.  Orders Placed This Encounter  Procedures  . Strep Gp B NAA  . Tdap vaccine greater than or equal to 7yo IM  . POC Urinalysis Dipstick OB   By signing my name below, I, Clerance Lav, attest that this documentation has been prepared under the direction and in the presence of Jonnie Kind, MD. Electronically Signed: [place]. 10/13/19. 2:54 PM.  I personally performed the services described in this  documentation, which was SCRIBED in my presence. The recorded information has been reviewed and considered accurate. It has been edited as necessary during review. Jonnie Kind, MD

## 2019-10-14 ENCOUNTER — Encounter (HOSPITAL_COMMUNITY): Payer: Self-pay | Admitting: *Deleted

## 2019-10-14 ENCOUNTER — Telehealth (HOSPITAL_COMMUNITY): Payer: Self-pay | Admitting: *Deleted

## 2019-10-14 NOTE — Telephone Encounter (Signed)
Preadmission screen  

## 2019-10-15 ENCOUNTER — Ambulatory Visit: Payer: Medicaid Other | Admitting: *Deleted

## 2019-10-15 ENCOUNTER — Other Ambulatory Visit: Payer: Self-pay | Admitting: Obstetrics and Gynecology

## 2019-10-15 ENCOUNTER — Ambulatory Visit: Payer: Medicaid Other | Admitting: Obstetrics and Gynecology

## 2019-10-15 ENCOUNTER — Other Ambulatory Visit: Payer: Self-pay

## 2019-10-15 ENCOUNTER — Ambulatory Visit (INDEPENDENT_AMBULATORY_CARE_PROVIDER_SITE_OTHER): Payer: Medicaid Other | Admitting: Obstetrics and Gynecology

## 2019-10-15 DIAGNOSIS — Z3A36 36 weeks gestation of pregnancy: Secondary | ICD-10-CM

## 2019-10-15 DIAGNOSIS — Z3403 Encounter for supervision of normal first pregnancy, third trimester: Secondary | ICD-10-CM

## 2019-10-15 DIAGNOSIS — Z3400 Encounter for supervision of normal first pregnancy, unspecified trimester: Secondary | ICD-10-CM

## 2019-10-15 DIAGNOSIS — F1290 Cannabis use, unspecified, uncomplicated: Secondary | ICD-10-CM

## 2019-10-15 DIAGNOSIS — O0933 Supervision of pregnancy with insufficient antenatal care, third trimester: Secondary | ICD-10-CM

## 2019-10-15 LAB — CERVICOVAGINAL ANCILLARY ONLY
Chlamydia: NEGATIVE
Comment: NEGATIVE
Comment: NORMAL
Neisseria Gonorrhea: NEGATIVE

## 2019-10-15 LAB — POCT URINALYSIS DIPSTICK OB
Blood, UA: NEGATIVE
Glucose, UA: NEGATIVE
Ketones, UA: NEGATIVE
Leukocytes, UA: NEGATIVE
Nitrite, UA: NEGATIVE
POC,PROTEIN,UA: NEGATIVE

## 2019-10-15 LAB — STREP GP B NAA: Strep Gp B NAA: NEGATIVE

## 2019-10-15 NOTE — Progress Notes (Signed)
Patient ID: Adrienne Parker, female   DOB: 1995/09/26, 24 y.o.   MRN: [id number]    [facility] PREGNANCY VISIT Patient name: Adrienne Parker MRN [id number]  Date of birth: [birth year] Chief Complaint:   Routine Prenatal Visit (NST)  History of Present Illness:   Adrienne Parker is a 24 y.o. G1P0000 female at [redacted]w[redacted]d with an Estimated Date of Delivery: 11/09/19 being seen today for ongoing management of a high-risk pregnancy complicated by chronic hypertension currently on no meds.  Today she reports no complaints.  .  .   . denies leaking of fluid.  Review of Systems:   Pertinent items are noted in HPI Denies abnormal vaginal discharge w/ itching/odor/irritation, headaches, visual changes, shortness of breath, chest pain, abdominal pain, severe nausea/vomiting, or problems with urination or bowel movements unless otherwise stated above. Pertinent History Reviewed:  Reviewed past medical,surgical, social, obstetrical and family history.  Reviewed problem list, medications and allergies. Physical Assessment:  There were no vitals filed for this visit.There is no height or weight on file to calculate BMI.           Physical Examination:   General appearance: alert, well appearing, and in no distress  Mental status: alert, oriented to person, place, and time, normal mood, behavior, speech, dress, motor activity, and thought processes, affect appropriate to mood  Skin: warm & dry   Extremities:      Cardiovascular: normal heart rate noted  Respiratory: normal respiratory effort, no distress  Abdomen: gravid, soft, non-tender  Pelvic: Cervical exam deferred         Fetal Status:          Fetal Surveillance Testing today: NST reactive  Chaperone: n/a    Results for orders placed or performed in visit on 10/15/19 (from the past 24 hour(s))  POC Urinalysis Dipstick OB   Collection Time: 10/15/19 10:13 AM  Result Value Ref Range   Color, UA     Clarity, UA     Glucose, UA Negative Negative   Bilirubin, UA     Ketones, UA neg    Spec Grav, UA     Blood, UA neg    pH, UA     POC,PROTEIN,UA Negative Negative, Trace, Small (1+), Moderate (2+), Large (3+), 4+   Urobilinogen, UA     Nitrite, UA neg    Leukocytes, UA Negative Negative   Appearance     Odor      Assessment & Plan:  1) High-risk pregnancy G1P0000 at [redacted]w[redacted]d with an Estimated Date of Delivery: 11/09/19   2) CHTN, stable, no meds  Meds: No orders of the defined types were placed in this encounter.  Labs/procedures today: NST   Treatment Plan:  IOL due to [facility] 10/21/2019  Reviewed: Term labor symptoms and general obstetric precautions including but not limited to vaginal bleeding, contractions, leaking of fluid and fetal movement were reviewed in detail with the patient.  All questions were answered. Has home bp cuff. Check bp weekly, let us know if >140/90.   Follow-up: No follow-ups on file.  No orders of the defined types were placed in this encounter.  By signing my name below, I, Arnette Norris, attest that this documentation has been prepared under the direction and in the presence of Tilda Burrow, MD. Electronically Signed: Arnette Norris Medical Scribe. 10/15/19. 11:18 AM.  I personally performed the services described in this documentation, which was SCRIBED in my presence. The recorded information has been reviewed  and considered accurate. It has been edited as necessary during review. Jonnie Kind, MD

## 2019-10-19 ENCOUNTER — Other Ambulatory Visit: Payer: Self-pay

## 2019-10-19 ENCOUNTER — Ambulatory Visit (INDEPENDENT_AMBULATORY_CARE_PROVIDER_SITE_OTHER): Payer: Medicaid Other | Admitting: *Deleted

## 2019-10-19 ENCOUNTER — Other Ambulatory Visit (HOSPITAL_COMMUNITY)
Admission: RE | Admit: 2019-10-19 | Discharge: 2019-10-19 | Disposition: A | Discharge status: Home / Self Care | Payer: Medicaid Other | Source: Ambulatory Visit | Attending: Family Medicine | Admitting: Family Medicine

## 2019-10-19 VITALS — BP 144/98 | HR 124 | Wt 173.0 lb

## 2019-10-19 DIAGNOSIS — Z01812 Encounter for preprocedural laboratory examination: Secondary | ICD-10-CM | POA: Diagnosis not present

## 2019-10-19 DIAGNOSIS — Z331 Pregnant state, incidental: Secondary | ICD-10-CM

## 2019-10-19 DIAGNOSIS — Z3A37 37 weeks gestation of pregnancy: Secondary | ICD-10-CM | POA: Diagnosis not present

## 2019-10-19 DIAGNOSIS — O163 Unspecified maternal hypertension, third trimester: Secondary | ICD-10-CM

## 2019-10-19 DIAGNOSIS — F1290 Cannabis use, unspecified, uncomplicated: Secondary | ICD-10-CM

## 2019-10-19 DIAGNOSIS — O133 Gestational [pregnancy-induced] hypertension without significant proteinuria, third trimester: Secondary | ICD-10-CM

## 2019-10-19 DIAGNOSIS — Z1389 Encounter for screening for other disorder: Secondary | ICD-10-CM

## 2019-10-19 DIAGNOSIS — O0933 Supervision of pregnancy with insufficient antenatal care, third trimester: Secondary | ICD-10-CM

## 2019-10-19 DIAGNOSIS — Z20822 Contact with and (suspected) exposure to covid-19: Secondary | ICD-10-CM | POA: Insufficient documentation

## 2019-10-19 DIAGNOSIS — O0990 Supervision of high risk pregnancy, unspecified, unspecified trimester: Secondary | ICD-10-CM

## 2019-10-19 LAB — POCT URINALYSIS DIPSTICK OB
Blood, UA: NEGATIVE
Glucose, UA: NEGATIVE
Ketones, UA: NEGATIVE
Leukocytes, UA: NEGATIVE
Nitrite, UA: NEGATIVE
POC,PROTEIN,UA: NEGATIVE

## 2019-10-19 NOTE — Progress Notes (Signed)
   NURSE VISIT- NST  SUBJECTIVE:  Adrienne Parker is a 24 y.o. G1P0000 female at [redacted]w[redacted]d, here for a NST for pregnancy complicated by [facility].  She reports active fetal movement, contractions: none, vaginal bleeding: none, membranes: intact.   OBJECTIVE:  BP (!) 144/98   Pulse (!) 124   Wt 173 lb (78.5 kg)   BMI 31.64 kg/m   Appears well, no apparent distress  Results for orders placed or performed in visit on 10/19/19 (from the past 24 hour(s))  POC Urinalysis Dipstick OB   Collection Time: 10/19/19 10:49 AM  Result Value Ref Range   Color, UA     Clarity, UA     Glucose, UA Negative Negative   Bilirubin, UA     Ketones, UA neg    Spec Grav, UA     Blood, UA neg    pH, UA     POC,PROTEIN,UA Negative Negative, Trace, Small (1+), Moderate (2+), Large (3+), 4+   Urobilinogen, UA     Nitrite, UA neg    Leukocytes, UA Negative Negative   Appearance     Odor      NST: FHR baseline 140 bpm, Variability: moderate, Accelerations:present, Decelerations:  Absent= Cat 1/Reactive Toco: none   ASSESSMENT: G1P0000 at [redacted]w[redacted]d with GHTN NST reactive  PLAN: EFM strip reviewed by Dr. Despina Hidden   Recommendations: keep next appointment as scheduled    Jobe Marker  10/19/2019 1:22 PM

## 2019-10-20 ENCOUNTER — Other Ambulatory Visit: Payer: Self-pay | Admitting: Advanced Practice Midwife

## 2019-10-20 LAB — SARS CORONAVIRUS 2 (TAT 6-24 HRS): SARS Coronavirus 2: NEGATIVE

## 2019-10-21 ENCOUNTER — Encounter (HOSPITAL_COMMUNITY): Payer: Self-pay | Admitting: Obstetrics and Gynecology

## 2019-10-21 ENCOUNTER — Inpatient Hospital Stay (HOSPITAL_COMMUNITY)
Admission: AD | Admit: 2019-10-21 | Discharge: 2019-10-24 | DRG: 807 | Disposition: A | Discharge status: Home / Self Care | Payer: Medicaid Other | Attending: Family Medicine | Admitting: Family Medicine

## 2019-10-21 ENCOUNTER — Inpatient Hospital Stay (HOSPITAL_COMMUNITY): Payer: Medicaid Other

## 2019-10-21 ENCOUNTER — Other Ambulatory Visit: Payer: Self-pay

## 2019-10-21 DIAGNOSIS — Z30017 Encounter for initial prescription of implantable subdermal contraceptive: Secondary | ICD-10-CM | POA: Diagnosis not present

## 2019-10-21 DIAGNOSIS — F1290 Cannabis use, unspecified, uncomplicated: Secondary | ICD-10-CM | POA: Diagnosis present

## 2019-10-21 DIAGNOSIS — O99334 Smoking (tobacco) complicating childbirth: Secondary | ICD-10-CM | POA: Diagnosis present

## 2019-10-21 DIAGNOSIS — F17210 Nicotine dependence, cigarettes, uncomplicated: Secondary | ICD-10-CM | POA: Diagnosis present

## 2019-10-21 DIAGNOSIS — F329 Major depressive disorder, single episode, unspecified: Secondary | ICD-10-CM | POA: Diagnosis present

## 2019-10-21 DIAGNOSIS — F1210 Cannabis abuse, uncomplicated: Secondary | ICD-10-CM | POA: Diagnosis not present

## 2019-10-21 DIAGNOSIS — O134 Gestational [pregnancy-induced] hypertension without significant proteinuria, complicating childbirth: Secondary | ICD-10-CM | POA: Diagnosis present

## 2019-10-21 DIAGNOSIS — O99323 Drug use complicating pregnancy, third trimester: Secondary | ICD-10-CM | POA: Diagnosis not present

## 2019-10-21 DIAGNOSIS — F32A Depression, unspecified: Secondary | ICD-10-CM | POA: Diagnosis present

## 2019-10-21 DIAGNOSIS — Z3A37 37 weeks gestation of pregnancy: Secondary | ICD-10-CM | POA: Diagnosis not present

## 2019-10-21 DIAGNOSIS — O133 Gestational [pregnancy-induced] hypertension without significant proteinuria, third trimester: Secondary | ICD-10-CM | POA: Diagnosis not present

## 2019-10-21 DIAGNOSIS — O99324 Drug use complicating childbirth: Secondary | ICD-10-CM | POA: Diagnosis present

## 2019-10-21 DIAGNOSIS — O99333 Smoking (tobacco) complicating pregnancy, third trimester: Secondary | ICD-10-CM | POA: Diagnosis not present

## 2019-10-21 DIAGNOSIS — O139 Gestational [pregnancy-induced] hypertension without significant proteinuria, unspecified trimester: Secondary | ICD-10-CM | POA: Diagnosis present

## 2019-10-21 DIAGNOSIS — F17200 Nicotine dependence, unspecified, uncomplicated: Secondary | ICD-10-CM | POA: Diagnosis present

## 2019-10-21 DIAGNOSIS — O0933 Supervision of pregnancy with insufficient antenatal care, third trimester: Secondary | ICD-10-CM

## 2019-10-21 LAB — CBC
HCT: 35.5 % — ABNORMAL LOW (ref 36.0–46.0)
HCT: 34.6 % — ABNORMAL LOW (ref 36.0–46.0)
Hemoglobin: 12.3 g/dL (ref 12.0–15.0)
Hemoglobin: 12 g/dL (ref 12.0–15.0)
MCH: 32.2 pg (ref 26.0–34.0)
MCH: 32.1 pg (ref 26.0–34.0)
MCHC: 34.6 g/dL (ref 30.0–36.0)
MCHC: 34.7 g/dL (ref 30.0–36.0)
MCV: 92.9 fL (ref 80.0–100.0)
MCV: 92.5 fL (ref 80.0–100.0)
Platelets: 261 10*3/uL (ref 150–400)
Platelets: 247 10*3/uL (ref 150–400)
RBC: 3.82 MIL/uL — ABNORMAL LOW (ref 3.87–5.11)
RBC: 3.74 MIL/uL — ABNORMAL LOW (ref 3.87–5.11)
RDW: 12.9 % (ref 11.5–15.5)
RDW: 13.3 % (ref 11.5–15.5)
WBC: 7.8 10*3/uL (ref 4.0–10.5)
WBC: 11.4 10*3/uL — ABNORMAL HIGH (ref 4.0–10.5)
nRBC: 0 % (ref 0.0–0.2)
nRBC: 0 % (ref 0.0–0.2)

## 2019-10-21 LAB — TYPE AND SCREEN
ABO/RH(D): O POS
Antibody Screen: NEGATIVE

## 2019-10-21 LAB — ABO/RH: ABO/RH(D): O POS

## 2019-10-21 MED ORDER — OXYTOCIN 40 UNITS IN NORMAL SALINE INFUSION - SIMPLE MED
2.5000 [IU]/h | INTRAVENOUS | Status: DC
  Administered 2019-10-22: 2.5 [IU]/h via INTRAVENOUS

## 2019-10-21 MED ORDER — ACETAMINOPHEN 325 MG PO TABS: 650.0000 mg | ORAL_TABLET | ORAL | Status: DC | PRN

## 2019-10-21 MED ORDER — TERBUTALINE SULFATE 1 MG/ML IJ SOLN: 0.2500 mg | Freq: Once | INTRAMUSCULAR | Status: DC | PRN

## 2019-10-21 MED ORDER — FENTANYL-BUPIVACAINE-NACL 0.5-0.125-0.9 MG/250ML-% EP SOLN
12.0000 mL/h | EPIDURAL | Status: DC | PRN
  Filled 2019-10-21 (×2): qty 250

## 2019-10-21 MED ORDER — PHENYLEPHRINE 40 MCG/ML (10ML) SYRINGE FOR IV PUSH (FOR BLOOD PRESSURE SUPPORT)
80.0000 ug | PREFILLED_SYRINGE | INTRAVENOUS | Status: DC | PRN
  Filled 2019-10-21: qty 10

## 2019-10-21 MED ORDER — LACTATED RINGERS IV SOLN: INTRAVENOUS | Status: DC

## 2019-10-21 MED ORDER — MISOPROSTOL 50MCG HALF TABLET
50.0000 ug | ORAL_TABLET | ORAL | Status: DC | PRN
  Administered 2019-10-21 (×2): 50 ug via BUCCAL
  Filled 2019-10-21 (×2): qty 1

## 2019-10-21 MED ORDER — DIPHENHYDRAMINE HCL 50 MG/ML IJ SOLN: 12.5000 mg | INTRAMUSCULAR | Status: DC | PRN

## 2019-10-21 MED ORDER — LACTATED RINGERS IV SOLN: 500.0000 mL | Freq: Once | INTRAVENOUS | Status: DC

## 2019-10-21 MED ORDER — FLEET ENEMA 7-19 GM/118ML RE ENEM: 1.0000 | ENEMA | RECTAL | Status: DC | PRN

## 2019-10-21 MED ORDER — FENTANYL CITRATE (PF) 100 MCG/2ML IJ SOLN
100.0000 ug | INTRAMUSCULAR | Status: DC | PRN
  Administered 2019-10-21 – 2019-10-22 (×10): 100 ug via INTRAVENOUS
  Filled 2019-10-21 (×10): qty 2

## 2019-10-21 MED ORDER — LIDOCAINE HCL (PF) 1 % IJ SOLN: 30.0000 mL | INTRAMUSCULAR | Status: DC | PRN

## 2019-10-21 MED ORDER — EPHEDRINE 5 MG/ML INJ: 10.0000 mg | INTRAVENOUS | Status: DC | PRN

## 2019-10-21 MED ORDER — OXYTOCIN 40 UNITS IN NORMAL SALINE INFUSION - SIMPLE MED
1.0000 m[IU]/min | INTRAVENOUS | Status: DC
  Administered 2019-10-22: 2 m[IU]/min via INTRAVENOUS
  Filled 2019-10-21: qty 1000

## 2019-10-21 MED ORDER — OXYCODONE-ACETAMINOPHEN 5-325 MG PO TABS: 2.0000 | ORAL_TABLET | ORAL | Status: DC | PRN

## 2019-10-21 MED ORDER — MISOPROSTOL 25 MCG QUARTER TABLET
25.0000 ug | ORAL_TABLET | ORAL | Status: DC | PRN
  Administered 2019-10-21: 25 ug via VAGINAL
  Filled 2019-10-21: qty 1

## 2019-10-21 MED ORDER — SOD CITRATE-CITRIC ACID 500-334 MG/5ML PO SOLN: 30.0000 mL | ORAL | Status: DC | PRN

## 2019-10-21 MED ORDER — PHENYLEPHRINE 40 MCG/ML (10ML) SYRINGE FOR IV PUSH (FOR BLOOD PRESSURE SUPPORT): 80.0000 ug | PREFILLED_SYRINGE | INTRAVENOUS | Status: DC | PRN

## 2019-10-21 MED ORDER — ONDANSETRON HCL 4 MG/2ML IJ SOLN
4.0000 mg | Freq: Four times a day (QID) | INTRAMUSCULAR | Status: DC | PRN
  Administered 2019-10-21: 4 mg via INTRAVENOUS
  Filled 2019-10-21: qty 2

## 2019-10-21 MED ORDER — OXYCODONE-ACETAMINOPHEN 5-325 MG PO TABS: 1.0000 | ORAL_TABLET | ORAL | Status: DC | PRN

## 2019-10-21 MED ORDER — OXYTOCIN BOLUS FROM INFUSION
500.0000 mL | Freq: Once | INTRAVENOUS | Status: AC
  Administered 2019-10-22: 500 mL via INTRAVENOUS

## 2019-10-21 MED ORDER — LACTATED RINGERS IV SOLN: 500.0000 mL | INTRAVENOUS | Status: DC | PRN

## 2019-10-21 NOTE — Progress Notes (Signed)
The patient, including her family deny history of problems with general anesthesia, including MH, Airway MP3, Patient denies pertinent medical problems, or complications with pregnancy. Patient questions and concerns were addressed.

## 2019-10-21 NOTE — H&P (Addendum)
OBSTETRIC ADMISSION HISTORY AND PHYSICAL  Adrienne Parker is a 24 y.o. female G1P0000 with IUP at [redacted]w[redacted]d by early ultrasound presenting for IOL for gHTN. She reports +FMs, No LOF, no VB, no blurry vision, headaches or peripheral edema, and RUQ pain.  She plans on breast and bottle feeding. She requests nexplanon for birth control. She received her prenatal care at [facility]   Dating: By early ultrasound and confirmed by anatomy ultrasound --->  Estimated Date of Delivery: 11/09/19  Sono:    @[redacted]w[redacted]d , CWD, normal anatomy, cephalic presentation, 1467g, EFW  Prenatal History/Complications: gHTN Tobacco Use THC use Late [facility]  Past Medical History: Past Medical History:  Diagnosis Date  . ADD (attention deficit disorder)   . Anxiety   . Contraceptive management 09/20/2015  . Depression   . Encounter for Nexplanon removal 09/20/2015  . Hearing loss   . Irregular bleeding 09/20/2015    Past Surgical History: Past Surgical History:  Procedure Laterality Date  . TONSILLECTOMY AND ADENOIDECTOMY    . tubes in ears      Obstetrical History: OB History    Gravida  1   Para  0   Term  0   Preterm  0   AB  0   Living  0     SAB  0   TAB  0   Ectopic  0   Multiple  0   Live Births              Social History Social History   Socioeconomic History  . Marital status: Single    Spouse name: Not on file  . Number of children: Not on file  . Years of education: Not on file  . Highest education level: GED or equivalent  Occupational History  . Not on file  Tobacco Use  . Smoking status: Current Every Day Smoker    Packs/day: 0.25    Years: 4.00    Pack years: 1.00    Types: Cigarettes  . Smokeless tobacco: Never Used  . Tobacco comment: "5 cigs per day"  Substance and Sexual Activity  . Alcohol use: Not Currently    Comment: rarely  . Drug use: Not Currently    Types: Marijuana  . Sexual activity: Yes    Birth control/protection: None  Other  Topics Concern  . Not on file  Social History Narrative  . Not on file   Social Determinants of Health   Financial Resource Strain:   . Difficulty of Paying Living Expenses: Not on file  Food Insecurity:   . Worried About 11/18/2015 in the Last Year: Not on file  . Ran Out of Food in the Last Year: Not on file  Transportation Needs:   . Lack of Transportation (Medical): Not on file  . Lack of Transportation (Non-Medical): Not on file  Physical Activity:   . Days of Exercise per Week: Not on file  . Minutes of Exercise per Session: Not on file  Stress:   . Feeling of Stress : Not on file  Social Connections:   . Frequency of Communication with Friends and Family: Not on file  . Frequency of Social Gatherings with Friends and Family: Not on file  . Attends Religious Services: Not on file  . Active Member of Clubs or Organizations: Not on file  . Attends Programme researcher, broadcasting/film/video Meetings: Not on file  . Marital Status: Not on file    Family History: Family History  Problem Relation Age of Onset  . Depression Mother   . Alcohol abuse Mother   . Drug abuse Mother   . Hearing loss Father   . Cancer Brother   . Alcohol abuse Brother   . Drug abuse Brother   . Stroke Maternal Aunt   . Diabetes Paternal Aunt   . Hypertension Maternal Grandfather   . Cancer Maternal Grandfather        prostate,skin  . Cancer Paternal Grandmother        colon  . Heart disease Other        maternal great grandma  . Mental illness Brother   . Bipolar disorder Maternal Grandmother     Allergies: No Known Allergies  Medications Prior to Admission  Medication Sig Dispense Refill Last Dose  . Blood Pressure Monitor MISC For regular home bp monitoring during pregnancy 1 each 0   . Prenatal Vit-Fe Fumarate-FA (PNV PRENATAL PLUS MULTIVITAMIN) 27-1 MG TABS Take 1 tablet by mouth daily. 30 tablet 11      Review of Systems   All systems reviewed and negative except as stated in  HPI  There were no vitals taken for this visit. General appearance: alert, cooperative and no distress Lungs: clear to auscultation bilaterally Heart: regular rate and rhythm Abdomen: soft, non-tender; bowel sounds normal Pelvic: Not performed yet as RN just did cervical check Extremities: Homans sign is negative, no sign of DVT Presentation: unsure Fetal monitoringBaseline: 150 bpm, Variability: Good {> 6 bpm) and Accelerations: Reactive Uterine activityNone Dilation: Fingertip Effacement (%): Thick Station: -2 Exam by:: lee   Prenatal labs: ABO, Rh: --/--/PENDING (03/04 1478) Antibody: PENDING (03/04 0738) Rubella: 1.92 (01/06 1043) RPR: Non Reactive (01/15 0858)  HBsAg: Negative (01/06 1043)  HIV: Non Reactive (01/15 0858)  GBS: --[name] (02/24 1500)  2 hr Glucola WNL Genetic screening  NT/IT: too late, MaterniT21: nl female Anatomy US normal  Prenatal Transfer Tool  Maternal Diabetes: No Genetic Screening: Normal Maternal Ultrasounds/Referrals: Normal Fetal Ultrasounds or other Referrals:  None Maternal Substance Abuse:  Yes:  Type: Smoker, Marijuana Significant Maternal Medications:  None Significant Maternal Lab Results: Group B Strep negative  Results for orders placed or performed during the hospital encounter of 10/21/19 (from the past 24 hour(s))  CBC   Collection Time: 10/21/19  7:38 AM  Result Value Ref Range   WBC 7.8 4.0 - 10.5 K/uL   RBC 3.82 (L) 3.87 - 5.11 MIL/uL   Hemoglobin 12.3 12.0 - 15.0 g/dL   HCT 29.5 (L) 62.1 - 30.8 %   MCV 92.9 80.0 - 100.0 fL   MCH 32.2 26.0 - 34.0 pg   MCHC 34.6 30.0 - 36.0 g/dL   RDW 65.7 84.6 - 96.2 %   Platelets 261 150 - 400 K/uL   nRBC 0.0 0.0 - 0.2 %  Type and screen   Collection Time: 10/21/19  7:38 AM  Result Value Ref Range   ABO/RH(D) PENDING    Antibody Screen PENDING    Sample Expiration      10/24/2019,2359 Performed at [facility], [address], [place], [place] [address]     Patient  Active Problem List   Diagnosis Date Noted  . Gestational hypertension affecting first pregnancy 10/21/2019  . Supervision of high risk pregnancy, antepartum 10/13/2019  . Hypertension affecting pregnancy in third trimester 10/09/2019  . Marijuana use 08/29/2019  . Late prenatal care in third trimester 08/25/2019  . Smoker 08/25/2019  . Supervision of normal first  pregnancy, antepartum 08/24/2019  . Attention deficit hyperactivity disorder (ADHD) 01/11/2016  . Depression 01/11/2016    Assessment/Plan:  Adrienne Parker is a 24 y.o. G1P0000 at [redacted]w[redacted]d here for IOL for gHTN  #Labor: PO Cytotec 69mcg given @ 0805. Plan for foley bulb. #Pain: Unsure. Considering an epidural #FWB: Category 1 tracing #ID:  GBS negative #MOF: Both #MOC: Inpatient Nexplanon #Circ:  Outpatient  Paulla Dolly, MD  10/21/2019, 8:23 AM  I saw and evaluated the patient. I agree with the findings and the plan of care as documented in the resident's note. Vertex by RN exam. Cytotec given this AM. Foley bulb placed with speculum now and filled with 60 mL water; patient tolerated well. AROM/Pit as indicated. Anticipate SVD. EFW 3100g.  Barrington Ellison, MD [facility] Family Medicine Fellow, [facility] for [employer], [place]

## 2019-10-21 NOTE — Progress Notes (Signed)
Labor Progress Note Adrienne Parker is a 24 y.o. G1P0000 at [redacted]w[redacted]d presented for  IOL for gHTN. S:  Doing well, feeling some contractions.  O:  BP 128/88   Pulse 68   Temp 98 F (36.7 C) (Oral)   Resp 16   Ht 5\' 2"  (1.575 m)   Wt 78.5 kg   BMI 31.64 kg/m  EFM: 140/moderate/acels present  CVE: Dilation: 5 Effacement (%): 70 Cervical Position: Anterior Station: -2 Presentation: Vertex Exam by:: [email or web address], RN   A&P: 24 y.o. G1P0000 [redacted]w[redacted]d for IOL for gHTN. #Labor: Expectant management, can initiate pitocin if cervical change slows. #Pain: Analgesia PRN #FWB: Cat I #GBS negative #cHTN - No severe range pressures, normotensive currently with most recent 128/88  [redacted]w[redacted]d, DO 10:24 PM

## 2019-10-21 NOTE — Progress Notes (Signed)
Labor Progress Note Yazmeen Woolf is a 24 y.o. G1P0000 at [redacted]w[redacted]d presented for IOL for gHTN S:  Patient with some light contractions but no complaints of pain. States foley bulb just recently came out.   O:  BP 134/86   Pulse 76   Temp 98 F (36.7 C) (Oral)   Resp 16   Ht 5\' 2"  (1.575 m)   Wt 78.5 kg   BMI 31.64 kg/m  EFM: 145/moderate/acels present  CVE: Dilation: 4.5 Effacement (%): 70 Cervical Position: Anterior Station: -2 Presentation: Vertex Exam by:: [email or web address], RN   A&P: 24 y.o. G1P0000 [redacted]w[redacted]d presenting for IOL with gHTN #Labor: Foley bulb out, expectant management. #Pain: Analgesia PRN #FWB: Cat 1 #GBS negative  [redacted]w[redacted]d, DO 8:44 PM

## 2019-10-22 ENCOUNTER — Encounter (HOSPITAL_COMMUNITY): Payer: Self-pay | Admitting: Obstetrics and Gynecology

## 2019-10-22 ENCOUNTER — Inpatient Hospital Stay (HOSPITAL_COMMUNITY): Payer: Medicaid Other | Admitting: Anesthesiology

## 2019-10-22 DIAGNOSIS — O99333 Smoking (tobacco) complicating pregnancy, third trimester: Secondary | ICD-10-CM

## 2019-10-22 DIAGNOSIS — Z3A37 37 weeks gestation of pregnancy: Secondary | ICD-10-CM

## 2019-10-22 DIAGNOSIS — F1210 Cannabis abuse, uncomplicated: Secondary | ICD-10-CM

## 2019-10-22 DIAGNOSIS — O133 Gestational [pregnancy-induced] hypertension without significant proteinuria, third trimester: Secondary | ICD-10-CM

## 2019-10-22 DIAGNOSIS — O99323 Drug use complicating pregnancy, third trimester: Secondary | ICD-10-CM

## 2019-10-22 LAB — COMPREHENSIVE METABOLIC PANEL
ALT: 15 U/L (ref 0–44)
AST: 14 U/L — ABNORMAL LOW (ref 15–41)
Albumin: 2.6 g/dL — ABNORMAL LOW (ref 3.5–5.0)
Alkaline Phosphatase: 108 U/L (ref 38–126)
Anion gap: 10 (ref 5–15)
BUN: 5 mg/dL — ABNORMAL LOW (ref 6–20)
CO2: 21 mmol/L — ABNORMAL LOW (ref 22–32)
Calcium: 8.9 mg/dL (ref 8.9–10.3)
Chloride: 106 mmol/L (ref 98–111)
Creatinine, Ser: 0.48 mg/dL (ref 0.44–1.00)
GFR calc Af Amer: >60 mL/min (ref >60)
GFR calc non Af Amer: >60 mL/min (ref >60)
Glucose, Bld: 81 mg/dL (ref 70–99)
Potassium: 3.7 mmol/L (ref 3.5–5.1)
Sodium: 137 mmol/L (ref 135–145)
Total Bilirubin: 0.5 mg/dL (ref 0.3–1.2)
Total Protein: 5.4 g/dL — ABNORMAL LOW (ref 6.5–8.1)

## 2019-10-22 LAB — CBC
HCT: 34.5 % — ABNORMAL LOW (ref 36.0–46.0)
Hemoglobin: 11.9 g/dL — ABNORMAL LOW (ref 12.0–15.0)
MCH: 32 pg (ref 26.0–34.0)
MCHC: 34.5 g/dL (ref 30.0–36.0)
MCV: 92.7 fL (ref 80.0–100.0)
Platelets: 216 10*3/uL (ref 150–400)
RBC: 3.72 MIL/uL — ABNORMAL LOW (ref 3.87–5.11)
RDW: 13.2 % (ref 11.5–15.5)
WBC: 11.2 10*3/uL — ABNORMAL HIGH (ref 4.0–10.5)
nRBC: 0 % (ref 0.0–0.2)

## 2019-10-22 LAB — RPR: RPR Ser Ql: NONREACTIVE

## 2019-10-22 MED ORDER — LIDOCAINE-EPINEPHRINE (PF) 2 %-1:200000 IJ SOLN
INTRAMUSCULAR | Status: DC | PRN
  Administered 2019-10-22 (×2): 2 mL via EPIDURAL

## 2019-10-22 MED ORDER — ONDANSETRON HCL 4 MG/2ML IJ SOLN: 4.0000 mg | INTRAMUSCULAR | Status: DC | PRN

## 2019-10-22 MED ORDER — ONDANSETRON HCL 4 MG PO TABS: 4.0000 mg | ORAL_TABLET | ORAL | Status: DC | PRN

## 2019-10-22 MED ORDER — SIMETHICONE 80 MG PO CHEW: 80.0000 mg | CHEWABLE_TABLET | ORAL | Status: DC | PRN

## 2019-10-22 MED ORDER — INFLUENZA VAC SPLIT QUAD 0.5 ML IM SUSY
0.5000 mL | PREFILLED_SYRINGE | INTRAMUSCULAR | Status: DC
  Filled 2019-10-22: qty 0.5

## 2019-10-22 MED ORDER — OXYCODONE HCL 5 MG PO TABS: 5.0000 mg | ORAL_TABLET | ORAL | Status: DC | PRN

## 2019-10-22 MED ORDER — BENZOCAINE-MENTHOL 20-0.5 % EX AERO
1.0000 "application " | INHALATION_SPRAY | CUTANEOUS | Status: DC | PRN
  Administered 2019-10-22: 1 via TOPICAL
  Filled 2019-10-22: qty 56

## 2019-10-22 MED ORDER — DIBUCAINE (PERIANAL) 1 % EX OINT: 1.0000 "application " | TOPICAL_OINTMENT | CUTANEOUS | Status: DC | PRN

## 2019-10-22 MED ORDER — METHYLERGONOVINE MALEATE 0.2 MG/ML IJ SOLN: 0.2000 mg | INTRAMUSCULAR | Status: DC | PRN

## 2019-10-22 MED ORDER — COCONUT OIL OIL: 1.0000 "application " | TOPICAL_OIL | Status: DC | PRN

## 2019-10-22 MED ORDER — OXYCODONE HCL 5 MG PO TABS: 10.0000 mg | ORAL_TABLET | ORAL | Status: DC | PRN

## 2019-10-22 MED ORDER — NICOTINE 14 MG/24HR TD PT24
14.0000 mg | MEDICATED_PATCH | Freq: Every day | TRANSDERMAL | Status: DC
  Administered 2019-10-22 – 2019-10-23 (×2): 14 mg via TRANSDERMAL
  Filled 2019-10-22 (×2): qty 1

## 2019-10-22 MED ORDER — IBUPROFEN 600 MG PO TABS
600.0000 mg | ORAL_TABLET | Freq: Four times a day (QID) | ORAL | Status: DC
  Administered 2019-10-22 – 2019-10-24 (×8): 600 mg via ORAL
  Filled 2019-10-22 (×8): qty 1

## 2019-10-22 MED ORDER — PRENATAL MULTIVITAMIN CH
1.0000 | ORAL_TABLET | Freq: Every day | ORAL | Status: DC
  Administered 2019-10-23 – 2019-10-24 (×2): 1 via ORAL
  Filled 2019-10-22 (×2): qty 1

## 2019-10-22 MED ORDER — METHYLERGONOVINE MALEATE 0.2 MG PO TABS: 0.2000 mg | ORAL_TABLET | ORAL | Status: DC | PRN

## 2019-10-22 MED ORDER — SODIUM CHLORIDE (PF) 0.9 % IJ SOLN
INTRAMUSCULAR | Status: DC | PRN
  Administered 2019-10-22: 12 mL/h via EPIDURAL

## 2019-10-22 MED ORDER — ACETAMINOPHEN 325 MG PO TABS: 650.0000 mg | ORAL_TABLET | ORAL | Status: DC | PRN

## 2019-10-22 MED ORDER — DIPHENHYDRAMINE HCL 25 MG PO CAPS: 25.0000 mg | ORAL_CAPSULE | Freq: Four times a day (QID) | ORAL | Status: DC | PRN

## 2019-10-22 MED ORDER — OXYTOCIN 40 UNITS IN NORMAL SALINE INFUSION - SIMPLE MED: 2.5000 [IU]/h | INTRAVENOUS | Status: DC | PRN

## 2019-10-22 MED ORDER — WITCH HAZEL-GLYCERIN EX PADS: 1.0000 "application " | MEDICATED_PAD | CUTANEOUS | Status: DC | PRN

## 2019-10-22 MED ORDER — TETANUS-DIPHTH-ACELL PERTUSSIS 5-2.5-18.5 LF-MCG/0.5 IM SUSP: 0.5000 mL | Freq: Once | INTRAMUSCULAR | Status: DC

## 2019-10-22 NOTE — Progress Notes (Signed)
Patient ID: Adrienne Parker, female   DOB: June 10, 1996, 24 y.o.   MRN: [id number]  Feeling ctx as stronger; has gotten 10 doses of Fentanyl, most recent at 0809; does want an epidural when things get more active  BP 139/87, 125/77, P 80 FHR 120s, +accels, no decels, Cat 1 Ctx q 2-3 mins with Pit @ 59mu/min Cx 5/70/vtx -2, BBOW was AROMed for clear fluid  IUP@37 .3wks gHTN- stable BPs IOL process with favorable cx  Hopeful that she will get into active labor now Plan to check cx in 2-3 hrs or sooner prn  Arabella Merles CNM 10/22/2019

## 2019-10-22 NOTE — Progress Notes (Signed)
LABOR PROGRESS NOTE  Adrienne Parker is a 24 y.o. G1P0000 at [redacted]w[redacted]d admitted for IOL for gHTN.  Subjective: Patient doing well. Starting to fall asleep and feel more comfortable since the epidural. Feels pressure every once in a while.    Objective: BP 113/60   Pulse 92   Temp 98 F (36.7 C) (Oral)   Resp 16   Ht 5\' 2"  (1.575 m)   Wt 78.5 kg   BMI 31.64 kg/m  or  Vitals:   10/22/19 1145 10/22/19 1150 10/22/19 1156 10/22/19 1200  BP: 120/74 123/70 (!) 114/58 113/60  Pulse: 76 81 78 92  Resp:   16   Temp:      TempSrc:      Weight:      Height:        Dilation: 5 Effacement (%): 70 Cervical Position: Posterior Station: -2 Presentation: Vertex Exam by:: shaw,cnm FHT: baseline rate 135, moderate varibility, reactive acel, no decel Toco: q2-3  Labs: Lab Results  Component Value Date   WBC 11.2 (H) 10/22/2019   HGB 11.9 (L) 10/22/2019   HCT 34.5 (L) 10/22/2019   MCV 92.7 10/22/2019   PLT 216 10/22/2019    Patient Active Problem List   Diagnosis Date Noted  . Gestational hypertension affecting first pregnancy 10/21/2019  . Supervision of high risk pregnancy, antepartum 10/13/2019  . Hypertension affecting pregnancy in third trimester 10/09/2019  . Marijuana use 08/29/2019  . Late prenatal care in third trimester 08/25/2019  . Smoker 08/25/2019  . Supervision of normal first pregnancy, antepartum 08/24/2019  . Attention deficit hyperactivity disorder (ADHD) 01/11/2016  . Depression 01/11/2016    Assessment / Plan: 24 y.o. G1P0000 at [redacted]w[redacted]d here for IOL due to gHTN.  Labor: S/p Cytotec x3 and FB. Started pit earlier this morning. AROM around 0930. Continue to monitor for active labor.  Fetal Wellbeing:  Cat 1 Pain Control:  Epidural GBS: Negative Anticipated MOD:  VD gHTN: well controlled without medicine  [redacted]w[redacted]d, MD OB Resident 10/22/2019, 12:35 PM

## 2019-10-22 NOTE — Progress Notes (Signed)
Labor Progress Note Payson Crumby is a 24 y.o. G1P0000 at [redacted]w[redacted]d presented for IOL for gHTN S:  Feeling some contractions but is not in pain. O:  BP 124/84   Pulse 74   Temp 98.2 F (36.8 C) (Oral)   Resp 16   Ht 5\' 2"  (1.575 m)   Wt 78.5 kg   BMI 31.64 kg/m  EFM: 120/moderate/acels present  CVE: Dilation: 5 Effacement (%): 70 Cervical Position: Posterior Station: -2 Presentation: Vertex Exam by:: [email or web address], RN   A&P: 24 y.o. G1P0000 [redacted]w[redacted]d for IOL for gHTN #Labor: On pitocin, CVE unchanged since last check #Pain: Analgesia PRN #FWB: Cat I #GBS negative #cHTN - No severe range pressures, normotensive currently at 124/84   [redacted]w[redacted]d, DO 6:06 AM

## 2019-10-22 NOTE — Discharge Summary (Addendum)
Postpartum Discharge Summary      Patient Name: Adrienne Parker DOB: 1995/12/01 MRN: [id number]  Date of admission: 10/21/2019 Delivering Provider: Serita Grammes D   Date of discharge: 10/24/2019  Admitting diagnosis: Gestational hypertension affecting first pregnancy [O13.9] Intrauterine pregnancy: [redacted]w[redacted]d    Secondary diagnosis:  Active Problems:   Depression   Late prenatal care in third trimester   Smoker   Marijuana use   Gestational hypertension affecting first pregnancy   NSVD (normal spontaneous vaginal delivery)  Additional problems: none     Discharge diagnosis: Term Pregnancy Delivered and Gestational Hypertension                                                                                                Post partum procedures:Nexplanon insertion   Augmentation: AROM, Pitocin, Cytotec and Foley Balloon  Complications: None  Hospital course:  Induction of Labor With Vaginal Delivery   24y.o. yo G1P1001 at 352w3das admitted to the hospital 10/21/2019 for induction of labor.  Indication for induction: Gestational hypertension. Her BPs were elevated but not in severe range; pre-e bloodwork was neg (P/C ratio on 2/19 was neg). Patient had an uncomplicated labor course delivering approx 30h after IOL began.  BPs were borderline, 130s/80s to 140s/90s so Vasotec 5 mg was started on 10/24/19, prior to pt discharge.  Membrane Rupture Time/Date: 9:15 AM ,10/22/2019   Intrapartum Procedures: Episiotomy: None [1]                                         Lacerations:  None [1]  Patient had delivery of a Viable infant.  Information for the patient's newborn:  GrSantiaga, Butzin0[[id number]]Delivery Method: Vaginal, Spontaneous(Filed from Delivery Summary)    10/22/2019  Details of delivery can be found in separate delivery note.  Patient had a routine postpartum course with BPs 130s/80s to 140s/90s. She also desired Nexplanon placement on PPD#1, placed 10/23/19. A SW consult  was ordered to f/u with depression/grief from loss (parents) as expressed during her pregnancy. Patient is discharged home 10/24/19. Delivery time: 3:08 PM    Magnesium Sulfate received: No BMZ received: No Rhophylac:N/A MMR:N/A Transfusion:No  Physical exam  Vitals:   10/23/19 0630 10/23/19 1530 10/23/19 2113 10/24/19 0505  BP: 114/86 129/86 140/89 134/88  Pulse: 86 84 93 88  Resp: '18 18 16 16  ' Temp: 98.1 F (36.7 C) 98.1 F (36.7 C) 98 F (36.7 C) 98.2 F (36.8 C)  TempSrc: Oral Oral Oral Oral  SpO2: 98%  99% 100%  Weight:      Height:       General: alert, cooperative and no distress Lochia: appropriate Uterine Fundus: firm Incision: N/A DVT Evaluation: No evidence of DVT seen on physical exam. Labs: Lab Results  Component Value Date   WBC 11.2 (H) 10/22/2019   HGB 11.9 (L) 10/22/2019   HCT 34.5 (L) 10/22/2019   MCV 92.7 10/22/2019   PLT 216 10/22/2019   CMP Latest  Ref Rng & Units 10/22/2019  Glucose 70 - 99 mg/dL 81  BUN 6 - 20 mg/dL 5(L)  Creatinine 0.44 - 1.00 mg/dL 0.48  Sodium 135 - 145 mmol/L 137  Potassium 3.5 - 5.1 mmol/L 3.7  Chloride 98 - 111 mmol/L 106  CO2 22 - 32 mmol/L 21(L)  Calcium 8.9 - 10.3 mg/dL 8.9  Total Protein 6.5 - 8.1 g/dL 5.4(L)  Total Bilirubin 0.3 - 1.2 mg/dL 0.5  Alkaline Phos 38 - 126 U/L 108  AST 15 - 41 U/L 14(L)  ALT 0 - 44 U/L 15   Edinburgh Score: Edinburgh Postnatal Depression Scale Screening Tool 10/23/2019  I have been able to laugh and see the funny side of things. 0  I have looked forward with enjoyment to things. 0  I have blamed myself unnecessarily when things went wrong. 1  I have been anxious or worried for no good reason. 1  I have felt scared or panicky for no good reason. 0  Things have been getting on top of me. 1  I have been so unhappy that I have had difficulty sleeping. 0  I have felt sad or miserable. 0  I have been so unhappy that I have been crying. 0  The thought of harming myself has occurred to  me. 0  Edinburgh Postnatal Depression Scale Total 3    Discharge instruction: per After Visit Summary and "Baby and Me Booklet".  After visit meds:  Allergies as of 10/24/2019   No Known Allergies     Medication List    TAKE these medications   acetaminophen 500 MG tablet Commonly known as: TYLENOL Take 500 mg by mouth every 6 (six) hours as needed for mild pain or headache. What changed: Another medication with the same name was added. Make sure you understand how and when to take each.   acetaminophen 325 MG tablet Commonly known as: Tylenol Take 2 tablets (650 mg total) by mouth every 4 (four) hours as needed (for pain scale < 4). What changed: You were already taking a medication with the same name, and this prescription was added. Make sure you understand how and when to take each.   Blood Pressure Monitor Misc For regular home bp monitoring during pregnancy   enalapril 5 MG tablet Commonly known as: VASOTEC Take 1 tablet (5 mg total) by mouth daily.   ibuprofen 600 MG tablet Commonly known as: ADVIL Take 1 tablet (600 mg total) by mouth every 6 (six) hours.   oxyCODONE 5 MG immediate release tablet Commonly known as: Oxy IR/ROXICODONE Take 1 tablet (5 mg total) by mouth every 4 (four) hours as needed (pain scale 4-7).   PNV Prenatal Plus Multivitamin 27-1 MG Tabs Take 1 tablet by mouth daily.       Diet: routine diet  Activity: Advance as tolerated. Pelvic rest for 6 weeks.   Outpatient follow up:BP check in 1wk; PP visit in 4wks Follow up Appt:No future appointments. Follow up Visit: Follow-up Information    [facility] Follow up.   Why: In 1 week for BP check, and in 4-6 weeks for postpartum visit Contact information: [place] [address] [phone]          Please schedule this patient for Postpartum visit in: 4 weeks with the following provider: Any provider Virtual For C/S patients schedule nurse  incision check in weeks 2 weeks: no High risk pregnancy complicated by: gHTN Delivery mode:  SVD Anticipated Birth  Control:  PP Nexplanon placed PP Procedures needed: BP check- 1wk- can be virtual with RN Schedule Integrated BH visit: no   Newborn Data: Live born female  Birth Weight: 5 lb 8.9 oz (2520 g) APGAR: 9, 10  Newborn Delivery   Birth date/time: 10/22/2019 15:08:00 Delivery type: Vaginal, Spontaneous      Baby Feeding: both, pumping and formula Disposition:home with mother   10/24/2019 Fatima Blank, CNM

## 2019-10-22 NOTE — Discharge Instructions (Signed)

## 2019-10-22 NOTE — Anesthesia Preprocedure Evaluation (Signed)
Anesthesia Evaluation  Patient identified by MRN, date of birth, ID band Patient awake    Reviewed: Allergy & Precautions, NPO status , Patient's Chart, lab work & pertinent test results  Airway Mallampati: II  TM Distance: >3 FB Neck ROM: Full    Dental no notable dental hx.    Pulmonary neg pulmonary ROS, Current SmokerPatient did not abstain from smoking.,    Pulmonary exam normal breath sounds clear to auscultation       Cardiovascular hypertension (gHTN), Normal cardiovascular exam Rhythm:Regular Rate:Normal     Neuro/Psych PSYCHIATRIC DISORDERS Anxiety Depression negative neurological ROS     GI/Hepatic negative GI ROS, (+)     substance abuse  marijuana use,   Endo/Other  negative endocrine ROS  Renal/GU negative Renal ROS  negative genitourinary   Musculoskeletal negative musculoskeletal ROS (+)   Abdominal   Peds  (+) ATTENTION DEFICIT DISORDER WITHOUT HYPERACTIVITY Hematology negative hematology ROS (+)   Anesthesia Other Findings   Reproductive/Obstetrics (+) Pregnancy                             Anesthesia Physical Anesthesia Plan  ASA: III  Anesthesia Plan: Epidural   Post-op Pain Management:    Induction:   PONV Risk Score and Plan: Treatment may vary due to age or medical condition  Airway Management Planned: Natural Airway  Additional Equipment:   Intra-op Plan:   Post-operative Plan:   Informed Consent: I have reviewed the patients History and Physical, chart, labs and discussed the procedure including the risks, benefits and alternatives for the proposed anesthesia with the patient or authorized representative who has indicated his/her understanding and acceptance.       Plan Discussed with: Anesthesiologist  Anesthesia Plan Comments: (Patient identified. Risks, benefits, options discussed with patient including but not limited to bleeding,  infection, nerve damage, paralysis, failed block, incomplete pain control, headache, blood pressure changes, nausea, vomiting, reactions to medication, itching, and post partum back pain. Confirmed with bedside nurse the patient's most recent platelet count. Confirmed with the patient that they are not taking any anticoagulation, have any bleeding history or any family history of bleeding disorders. Patient expressed understanding and wishes to proceed. All questions were answered. )        Anesthesia Quick Evaluation

## 2019-10-22 NOTE — Progress Notes (Signed)
Vitals:   10/21/19 2223 10/22/19 0146  BP: 128/88 125/78  Pulse: 68 69  Resp: 16 16  Temp: 98 F (36.7 C) 98.1 F (36.7 C)   FHR Cat 1.  Ctx spacing out.  Cx still 5/70/-2.  Will start pitocin.

## 2019-10-23 DIAGNOSIS — Z30017 Encounter for initial prescription of implantable subdermal contraceptive: Secondary | ICD-10-CM

## 2019-10-23 MED ORDER — ETONOGESTREL 68 MG ~~LOC~~ IMPL
68.0000 mg | DRUG_IMPLANT | Freq: Once | SUBCUTANEOUS | Status: AC
  Administered 2019-10-23: 68 mg via SUBCUTANEOUS
  Filled 2019-10-23: qty 1

## 2019-10-23 MED ORDER — LIDOCAINE HCL 1 % IJ SOLN
0.0000 mL | Freq: Once | INTRAMUSCULAR | Status: AC | PRN
  Administered 2019-10-23: 20 mL via INTRADERMAL
  Filled 2019-10-23: qty 20

## 2019-10-23 NOTE — Procedures (Addendum)
   PROCEDURE NOTE  Adrienne Parker is a 25 y.o. [id number] who is now PPD#1 and desires Nexplanon insertion.   Nexplanon Insertion Procedure Patient identified, informed consent performed, consent signed. Patient does understand that irregular bleeding is a very common side effect of this medication. She was advised to have backup contraception for one week after placement. Appropriate time out taken.  Patient's left arm was prepped and draped in the usual sterile fashion. The ruler used to measure and mark insertion area.  Patient was prepped with alcohol swab and then injected with 5 ml of 1% lidocaine.  She was prepped with betadine, Nexplanon removed from packaging,  Device confirmed in needle, then inserted full length of needle and withdrawn per handbook instructions. Nexplanon was able to palpated in the patient's arm; patient palpated the insert herself. There was minimal blood loss.  Patient insertion site covered with guaze and a pressure bandage to reduce any bruising. The patient tolerated the procedure well and was given post procedure instructions.   Exp: 01/21/2022 Lot #: [id number]  Jerilynn Birkenhead, MD OB Family Medicine Fellow, [facility] for [employer], [facility]

## 2019-10-23 NOTE — Progress Notes (Signed)
Post Partum Day #1 Subjective: no complaints, up ad lib and tolerating PO; mostly breastfeeding/pumping; still would like inpatient Nexplanon; stable BPs overnight  Objective: Blood pressure 114/86, pulse 86, temperature 98.1 F (36.7 C), temperature source Oral, resp. rate 18, height 5\' 2"  (1.575 m), weight 78.5 kg, SpO2 98 %, unknown if currently breastfeeding.  Physical Exam:  General: alert, cooperative and no distress Lochia: appropriate Uterine Fundus: firm DVT Evaluation: No evidence of DVT seen on physical exam.  Recent Labs    10/21/19 1933 10/22/19 0935  HGB 12.0 11.9*  HCT 34.6* 34.5*    Assessment/Plan: Plan for discharge tomorrow  Will get Nexplanon placed later today   LOS: 2 days   12/22/19 CNM 10/23/2019, 7:40 AM

## 2019-10-23 NOTE — Clinical Social Work Maternal (Signed)
CLINICAL SOCIAL WORK MATERNAL/CHILD NOTE  Patient Details  Name: Adrienne Parker MRN: [id number] Date of Birth: 10/22/2019  Date:  10/23/2019  Clinical Social Worker Initiating Note:  Adrienne Parker, MSW, LCSWA Date/Time: Initiated:  10/23/19/1000     Child's Name:  Adrienne Parker   Biological Parents:  Mother(FOB Adrienne Parker DOB 04/04/1990)   Need for Interpreter:  None   Reason for Referral:  Current Substance Use/Substance Use During Pregnancy , Behavioral Health Concerns   Address:  [address] [place] [address]    Phone number:  [phone] (home)     Additional phone number: none provided  Household Members/Support Persons (HM/SP):   Household Member/Support Person 1   HM/SP Name Relationship DOB or Age  HM/SP -1 Adrienne Parker FOB 04/04/1990  HM/SP -2        HM/SP -3        HM/SP -4        HM/SP -5        HM/SP -6        HM/SP -7        HM/SP -8          Natural Supports (not living in the home):  Extended Family, Parent   Professional Supports: none provided  Employment: Unemployed   Type of Work: n/a   Education:  Some College   Homebound arranged: n/a  Financial Resources:  Medicaid   Other Resources:  Food Stamps , WIC   Cultural/Religious Considerations Which May Impact Care:  none provided  Strengths:  Ability to meet basic needs , Pediatrician chosen, Home prepared for child    Psychotropic Medications:         Pediatrician:    [place]  Pediatrician List:   [place]    [place]    [place]    [place] [facility]  [place]    [place]      Pediatrician Fax Number:    Risk Factors/Current Problems:  Substance Use , Mental Health Concerns    Cognitive State:  Alert , Able to Concentrate , Insightful    Mood/Affect:  Comfortable , Interested , Calm , Happy    CSW Assessment: CSW received consult for THC exposure during pregnacy and hx of Depression.  CSW met with  MOB to offer support and complete assessment.    CSW met with MOB at bedside to discuss substance and mental health history. FOB, Adrienne, was present on arrival, however , left the room to offer MOB privacy during assessment. Infant, Adrienne Parker remained in bassinet in room with MOB.  MOB was pleasant and engaged during visit.   MOB denied any SI, HI, or domestic violence. MOB identified current mood as "very happy". MOB denied any recent concerns with depression, however, she stated grief, related to the loss of both parents, five years ago, comes and goes. MOB reported going to counseling and taking medication for depression and anxiety in the past. MOB reported it has been over 3.5 years since either counseling or medications due to loss of  health insurance. However, she reported doing well. MOB identified positive self talk and encouragement and support as her coping strategies. MOB stated she is interested in restarting counseling if current insurance will cover it. CSW provided MOB with list of counseling options. MOB identified support system as FOB, his parents, and both sets of grandparents.   MOB reported THC use and stated last use was one month and a half ago. MOB stated plan not   to return to use. CSW informed MOB of Hospital Drug Policy and explained UDS came back negative and CDS is still pending. CSW also explained CPS report would be made, if positive. MOB denied any questions or concerns regarding policy.  CSW provided education regarding the baby blues period vs. perinatal mood disorders, discussed treatment and gave resources for mental health follow up if concerns arise.  CSW recommends self-evaluation during the postpartum time period using the New Mom Checklist from Postpartum Progress and encouraged MOB to contact a medical professional if symptoms are noted at any time. MOB did not appear to be displaying any acute mental health symptoms and denied any current SI, HI or DV.    CSW  provided review of Sudden Infant Death Syndrome (SIDS) precautions. MOB confirmed having all needed items for baby including car seat and bassinet. MOB reported infant will sleep in bassinet for safe sleeping space.     CSW identifies no further need for intervention and no barriers to discharge at this time.  CSW to continue to monitor UDS and CDS results and make [place] CPS report, if warranted.   CSW Plan/Description:  Sudden Infant Death Syndrome (SIDS) Education, Perinatal Mood and Anxiety Disorder (PMADs) Education, Hospital Drug Screen Policy Information, CSW Will Continue to Monitor Umbilical Cord Tissue Drug Screen Results and Make Report if Warranted, Other Information/Referral to Community Resources    Adrienne Parker D. Adrienne Parker, MSW, LCSWA Clinical Social Worker [phone] 10/23/2019, 3:05 PM  

## 2019-10-23 NOTE — Lactation Note (Signed)
This note was copied from a baby's chart. Lactation Consultation Note  Patient Name: Adrienne Parker [id number] Date: 10/23/2019 Reason for consult: Initial assessment;1st time breastfeeding;Infant < 6lbs;Early term 37-38.6wks P1, 13 hour female ETI infant less than 6 lbs.  Mom with hx: LPC, tobacco use, GHTN and anxiety with depression. Tools: Mom was given DEBP by RN to help establish milk supply due to infant not currently latching at breast. Infant had two voids, LC changed one void diaper while in room. Mom plans to obtain DEBP with her insurance mom is not on the [facility] program. Mom attempted to latch infant on right breast using the cross cradle hold position, but infant reluctant to latch at this time only held breast in mouth. Infant was reluctant to suckle no swallows on LC gloved finger consistent with actions at breast only held finger in mouth. Mom taught back hand expression and infant was given 8 mls of colostrum by spoon. Mom will continue to work towards latching infant at breast, mom knows if infant doesn't latch to offer pumped or hand expressed breast milk and give to infant by spoon. Mom knows to ask RN or LC for assistance with latching infant at breast. Mom will use DEBP every 3 hours for 15 minutes on initial setting. Parents will continue to do as much STS as possible. Mom made aware of O/P services, breastfeeding support groups, community resources, and our phone # for post-discharge questions.    Maternal Data Formula Feeding for Exclusion: Yes Reason for exclusion: Mother's choice to formula and breast feed on admission Has patient been taught Hand Expression?: Yes Does the patient have breastfeeding experience prior to this delivery?: No  Feeding Feeding Type: Breast Fed  LATCH Score Latch: Too sleepy or reluctant, no latch achieved, no sucking elicited.  Audible Swallowing: None  Type of Nipple: Everted at rest and after stimulation  Comfort (Breast/Nipple):  Soft / non-tender  Hold (Positioning): Assistance needed to correctly position infant at breast and maintain latch.  LATCH Score: 5  Interventions Interventions: Breast feeding basics reviewed;Assisted with latch;Adjust position;Skin to skin;Support pillows;Breast massage;Position options;Hand express;Expressed milk;DEBP  Lactation Tools Discussed/Used Tools: Pump Breast pump type: Double-Electric Breast Pump WIC Program: No Pump Review: Setup, frequency, and cleaning;Milk Storage Initiated by:: RN Date initiated:: 10/23/19   Consult Status Consult Status: Follow-up Date: 10/23/19 Follow-up type: In-patient    [name] 10/23/2019, 4:24 AM

## 2019-10-23 NOTE — Anesthesia Postprocedure Evaluation (Signed)
Anesthesia Post Note  Patient: Adrienne Parker  Procedure(s) Performed: AN AD HOC LABOR EPIDURAL     Patient location during evaluation: Mother Baby Anesthesia Type: Epidural Level of consciousness: awake and alert, oriented and patient cooperative Pain management: pain level controlled Vital Signs Assessment: post-procedure vital signs reviewed and stable Respiratory status: spontaneous breathing Cardiovascular status: stable Postop Assessment: no headache, epidural receding, patient able to bend at knees and no signs of nausea or vomiting Anesthetic complications: no Comments: Pt. States she is walking.      Last Vitals:  Vitals:   10/23/19 0227 10/23/19 0630  BP: 131/85 114/86  Pulse: 91 86  Resp: 18 18  Temp: 36.8 C 36.7 C  SpO2: 98% 98%    Last Pain:  Vitals:   10/23/19 1218  TempSrc:   PainSc: 0-No pain   Pain Goal:                   [facility]

## 2019-10-24 MED ORDER — ACETAMINOPHEN 325 MG PO TABS: 650.0000 mg | ORAL_TABLET | ORAL | 0 refills | Status: DC | PRN

## 2019-10-24 MED ORDER — ENALAPRIL MALEATE 5 MG PO TABS
5.0000 mg | ORAL_TABLET | Freq: Every day | ORAL | Status: DC
  Administered 2019-10-24: 5 mg via ORAL
  Filled 2019-10-24: qty 1

## 2019-10-24 MED ORDER — ENALAPRIL MALEATE 10 MG PO TABS: 10.0000 mg | ORAL_TABLET | Freq: Every day | ORAL | 2 refills | Status: DC

## 2019-10-24 MED ORDER — ENALAPRIL MALEATE 5 MG PO TABS: 5.0000 mg | ORAL_TABLET | Freq: Every day | ORAL | 2 refills | Status: DC

## 2019-10-24 MED ORDER — IBUPROFEN 600 MG PO TABS: 600.0000 mg | ORAL_TABLET | Freq: Four times a day (QID) | ORAL | 0 refills | Status: AC

## 2019-10-24 MED ORDER — OXYCODONE HCL 5 MG PO TABS: 5.0000 mg | ORAL_TABLET | ORAL | 0 refills | Status: AC | PRN

## 2019-10-24 NOTE — Lactation Note (Signed)
This note was copied from a baby's chart. Lactation Consultation Note  Patient Name: Adrienne Parker [id number] Date: 10/24/2019  P1, 35 hour ETI female infant. Per mom, due to infant not latch at breast previously she decided to pump only and formula fed infant. Per mom, she will continue to use DEBP every 3 hours for 15 minutes on initial setting and hand express afterwards to help establish her milk supply. Mom knows to call [facility] services if she changes her mind and wants assistance with latching infant at breast.    Maternal Data    Feeding Feeding Type: Bottle Fed - Formula Nipple Type: Extra Slow Flow  LATCH Score Latch: (Mother request to solely bottle feed)                 Interventions    Lactation Tools Discussed/Used     Consult Status      [name] 10/24/2019, 2:16 AM

## 2019-10-24 NOTE — Lactation Note (Signed)
This note was copied from a baby's chart. Lactation Consultation Note: Mother is mostly giving formula. She reports that she doesn't have a pump at home and father at the bedside reports that he can buy a pump if needed.  Mother has a hand pump.   Discussed plan to continue to pump every 2-3 hours for 15-20 mins  Discussed treatment and prevention of engorgement.   Discussed drying milk if she chooses not to continue to offer breastmilk.  Mother is aware of available LC services and community support.   Patient Name: Adrienne Parker [id number] Date: 10/24/2019 Reason for consult: Follow-up assessment   Maternal Data    Feeding Feeding Type: Bottle Fed - Formula Nipple Type: Extra Slow Flow  LATCH Score                   Interventions    Lactation Tools Discussed/Used     Consult Status Consult Status: Complete    Michel Bickers 10/24/2019, 11:21 AM

## 2019-10-29 ENCOUNTER — Other Ambulatory Visit: Payer: Self-pay

## 2019-10-29 ENCOUNTER — Telehealth (INDEPENDENT_AMBULATORY_CARE_PROVIDER_SITE_OTHER): Payer: Medicaid Other | Admitting: *Deleted

## 2019-10-29 ENCOUNTER — Encounter: Payer: Self-pay | Admitting: *Deleted

## 2019-10-29 VITALS — BP 132/85 | HR 95

## 2019-10-29 DIAGNOSIS — Z0130 Encounter for examination of blood pressure without abnormal findings: Secondary | ICD-10-CM | POA: Diagnosis not present

## 2019-10-29 NOTE — Progress Notes (Signed)
   NURSE VISIT- BLOOD PRESSURE CHECK  SUBJECTIVE:  Adrienne Parker is a 24 y.o. G5P1001 female here for BP check. She is postpartum, delivery date 10/22/2019    HYPERTENSION ROS:  Pregnant/postpartum:  . Severe headaches that don't go away with tylenol/other medicines: No  . Visual changes (seeing spots/double/blurred vision) No  . Severe pain under right breast breast or in center of upper chest No  . Severe nausea/vomiting No  . Taking medicines as instructed yes  GYN patient: . Taking medicines as instructed yes . Headaches  No . Chest pain No . Shortness of breath No . Swelling in legs/ankles No  OBJECTIVE:  BP 132/85 (BP Location: Left Arm, Patient Position: Sitting, Cuff Size: Normal)   Pulse 95   Breastfeeding No   Virtual visit with patient.   ASSESSMENT: Postpartum  blood pressure check  PLAN: Discussed with Dr. Emelda Fear   Recommendations: Increase Enalapril to 10mg . Check blood pressure daily and follow up in one week with nurse visit.    Follow-up: one week    10/29/2019 11:23 AM

## 2019-11-05 ENCOUNTER — Telehealth (INDEPENDENT_AMBULATORY_CARE_PROVIDER_SITE_OTHER): Payer: Medicaid Other

## 2019-11-05 ENCOUNTER — Other Ambulatory Visit: Payer: Self-pay

## 2019-11-05 VITALS — BP 130/85 | HR 102 | Ht 62.0 in | Wt 166.0 lb

## 2019-11-05 DIAGNOSIS — Z0130 Encounter for examination of blood pressure without abnormal findings: Secondary | ICD-10-CM

## 2019-11-05 NOTE — Progress Notes (Addendum)
   NURSE VISIT- BLOOD PRESSURE CHECK  SUBJECTIVE:  Adrienne Parker is a 24 y.o. G51P1001 female here for BP check. She is postpartum patient  OBJECTIVE:  BP 130/85 (BP Location: Right Arm, Patient Position: Sitting, Cuff Size: Normal)   Pulse (!) 102   Ht 5\' 2"  (1.575 m)   Wt 166 lb (75.3 kg)   Breastfeeding No   BMI 30.36 kg/m  ASSESSMENT:  Postpartum , blood pressure check  PLAN: Discussed with kimberly booker about B/P reading.keep taking Vasotec to mg, stop taking 2 days before appointment 11-29-19    Follow-up: 4 weeks   [facility]-06-1981 [facility]  11/05/2019 11:16 AM   Chart reviewed for nurse visit. Agree with plan of care.  11/07/2019, Cheral Marker 11/05/2019 1:32 PM

## 2019-11-10 ENCOUNTER — Telehealth: Payer: Self-pay | Admitting: *Deleted

## 2019-11-10 MED ORDER — ENALAPRIL MALEATE 10 MG PO TABS: 10.0000 mg | ORAL_TABLET | Freq: Every day | ORAL | 0 refills | Status: AC

## 2019-11-10 NOTE — Telephone Encounter (Signed)
Refill for vasotec sent in with correct strength.

## 2019-11-17 ENCOUNTER — Other Ambulatory Visit: Payer: Self-pay | Admitting: Women's Health

## 2019-11-17 DIAGNOSIS — F418 Other specified anxiety disorders: Secondary | ICD-10-CM

## 2019-11-29 ENCOUNTER — Telehealth: Payer: Medicaid Other | Admitting: Women's Health

## 2019-12-06 ENCOUNTER — Other Ambulatory Visit: Payer: Self-pay

## 2019-12-06 ENCOUNTER — Telehealth (INDEPENDENT_AMBULATORY_CARE_PROVIDER_SITE_OTHER): Payer: Medicaid Other | Admitting: Advanced Practice Midwife

## 2019-12-06 NOTE — Progress Notes (Signed)
TELEHEALTH VIRTUAL POSTPARTUM VISIT ENCOUNTER NOTE  I connected with@ on 12/06/19 at 11:50 AM EDT by telephone at home and verified that I am speaking with the correct person using two identifiers.   I discussed the limitations, risks, security and privacy concerns of performing an evaluation and management service by telephone and the availability of in person appointments. I also discussed with the patient that there may be a patient responsible charge related to this service. The patient expressed understanding and agreed to proceed.  Appointment Date: 12/06/2019  OBGYN Clinic: [facility]  Chief Complaint:  Postpartum Visit  History of Present Illness: Adrienne Parker is a 24 y.o.  G1P1001 (No LMP recorded.), seen for the above chief complaint. Her past medical history is significant for GHTN, on meds.  Last dose today.     123/78  She is s/p normal spontaneous vaginal delivery on 10/22/19 at 37.3 weeks; she was discharged to home on PPD#2. Pregnancy complicated by GHTN. Baby is doing well.    Vaginal bleeding or discharge: Yes  Got Nexplanon Mode of feeding infant: Bottle Intercourse: No  PP depression s/s: No .  Any bowel or bladder issues: No  Pap smear: no abnormalities (date: 09/09/19)  Review of Systems: Positive for nothing. Her 12 point review of systems is negative or as noted in the History of Present Illness.  Patient Active Problem List   Diagnosis Date Noted  . NSVD (normal spontaneous vaginal delivery) 10/24/2019  . Gestational hypertension affecting first pregnancy 10/21/2019  . Supervision of high risk pregnancy, antepartum 10/13/2019  . Hypertension affecting pregnancy in third trimester 10/09/2019  . Smoker 08/25/2019  . Supervision of normal first pregnancy, antepartum 08/24/2019  . Attention deficit hyperactivity disorder (ADHD) 01/11/2016  . Depression 01/11/2016    Medications Jeraldine Loots had no medications administered during this  visit. Current Outpatient Medications  Medication Sig Dispense Refill  . enalapril (VASOTEC) 10 MG tablet Take 1 tablet (10 mg total) by mouth daily. 30 tablet 0  . Prenatal Vit-Fe Fumarate-FA (PNV PRENATAL PLUS MULTIVITAMIN) 27-1 MG TABS Take 1 tablet by mouth daily. 30 tablet 11  . acetaminophen (TYLENOL) 500 MG tablet Take 500 mg by mouth every 6 (six) hours as needed for mild pain or headache.    . Blood Pressure Monitor MISC For regular home bp monitoring during pregnancy (Patient not taking: Reported on 12/06/2019) 1 each 0  . ibuprofen (ADVIL) 600 MG tablet Take 1 tablet (600 mg total) by mouth every 6 (six) hours. (Patient not taking: Reported on 12/06/2019) 30 tablet 0  . oxyCODONE (OXY IR/ROXICODONE) 5 MG immediate release tablet Take 1 tablet (5 mg total) by mouth every 4 (four) hours as needed (pain scale 4-7). (Patient not taking: Reported on 11/05/2019) 15 tablet 0   No current facility-administered medications for this visit.    Allergies Patient has no known allergies.  Physical Exam:  General:  Alert, oriented and cooperative.   Mental Status: Normal mood and affect perceived. Normal judgment and thought content.  Rest of physical exam deferred due to type of encounter  PP Depression Screening:   Edinburgh Postnatal Depression Scale - 12/06/19 1149      Edinburgh Postnatal Depression Scale:  In the Past 7 Days   I have been able to laugh and see the funny side of things.  0    I have looked forward with enjoyment to things.  0    I have blamed myself unnecessarily when things  went wrong.  0    I have been anxious or worried for no good reason.  0    I have felt scared or panicky for no good reason.  0    Things have been getting on top of me.  0    I have been so unhappy that I have had difficulty sleeping.  0    I have felt sad or miserable.  0    I have been so unhappy that I have been crying.  0    The thought of harming myself has occurred to me.  0    Edinburgh  Postnatal Depression Scale Total  0       Assessment:Patient is a 24 y.o. G1P1001 who is 6 weeks postpartum from a normal spontaneous vaginal delivery.  She is doing well.   Plan: Stop BP meds. .  RTC virtual BP check at the end of the week  I discussed the assessment and treatment plan with the patient. The patient was provided an opportunity to ask questions and all were answered. The patient agreed with the plan and demonstrated an understanding of the instructions.   The patient was advised to call back or seek an in-person evaluation/go to the ED for any concerning postpartum symptoms.  I provided 10 minutes of non-face-to-face time during this encounter.   Jacklyn Shell, CNM [facility] [employer], [facility]

## 2019-12-09 ENCOUNTER — Other Ambulatory Visit: Payer: Self-pay

## 2019-12-09 ENCOUNTER — Telehealth (INDEPENDENT_AMBULATORY_CARE_PROVIDER_SITE_OTHER): Payer: Medicaid Other | Admitting: *Deleted

## 2019-12-09 VITALS — BP 125/78 | HR 80

## 2019-12-09 DIAGNOSIS — Z0130 Encounter for examination of blood pressure without abnormal findings: Secondary | ICD-10-CM

## 2019-12-09 NOTE — Progress Notes (Signed)
   NURSE VISIT- BLOOD PRESSURE CHECK  SUBJECTIVE:  Adrienne Parker is a 24 y.o. G53P1001 female here for BP check. She is postpartum, delivery date 10/22/2019    HYPERTENSION ROS:  Pregnant/postpartum:  . Severe headaches that don't go away with tylenol/other medicines: No  . Visual changes (seeing spots/double/blurred vision) No  . Severe pain under right breast breast or in center of upper chest No  . Severe nausea/vomiting No  . Taking medicines as instructed no   OBJECTIVE:  BP 125/78 (BP Location: Left Arm, Patient Position: Sitting, Cuff Size: Normal)   Pulse 80   Appearance alert, well appearing, and in no distress.  ASSESSMENT: Postpartum  blood pressure check  PLAN: Discussed with Cyril Mourning, AGNP   Recommendations: no changes needed   Follow-up: as scheduled   Annamarie Dawley  12/09/2019 11:28 AM

## 2019-12-10 ENCOUNTER — Telehealth: Payer: Medicaid Other

## 2019-12-22 ENCOUNTER — Ambulatory Visit: Payer: Medicaid Other | Admitting: Obstetrics and Gynecology

## 2020-01-03 ENCOUNTER — Other Ambulatory Visit: Payer: Self-pay | Admitting: Women's Health

## 2020-01-03 DIAGNOSIS — F418 Other specified anxiety disorders: Secondary | ICD-10-CM

## 2020-03-27 ENCOUNTER — Encounter: Payer: Self-pay | Admitting: *Deleted

## 2020-03-29 ENCOUNTER — Ambulatory Visit: Payer: Medicaid Other | Admitting: Obstetrics and Gynecology

## 2020-09-25 ENCOUNTER — Encounter: Payer: Medicaid Other | Admitting: Advanced Practice Midwife

## 2020-10-10 ENCOUNTER — Ambulatory Visit: Payer: Self-pay | Admitting: Women's Health

## 2021-04-19 ENCOUNTER — Other Ambulatory Visit: Payer: Self-pay

## 2021-04-19 ENCOUNTER — Ambulatory Visit (HOSPITAL_COMMUNITY): Payer: Self-pay | Admitting: Psychiatry

## 2021-04-19 ENCOUNTER — Telehealth (HOSPITAL_COMMUNITY): Payer: Self-pay | Admitting: Psychiatry

## 2021-04-19 NOTE — Telephone Encounter (Signed)
Therapist attempted to contact patient twice via text through care agility platform for scheduled appointment, no response.  Therapist called patient and received no answer.  Voicemail was not available.

## 2021-04-30 ENCOUNTER — Telehealth (HOSPITAL_COMMUNITY): Payer: Self-pay | Admitting: Psychiatry

## 2021-04-30 NOTE — Telephone Encounter (Signed)
Patient called in to reschedule NP appt, due to past history of being a no show and the first NP appt after 1 year the patient no showed again. Advised due to not keeping appts reschedule in not an option due to patient not being seriously committed to keeping appts

## 2023-03-24 ENCOUNTER — Ambulatory Visit: Payer: Medicaid Other | Admitting: Obstetrics and Gynecology
